# Patient Record
Sex: Male | Born: 1980 | Race: Black or African American | Hispanic: No | Marital: Married | Smoking: Former smoker
Health system: Southern US, Community
[De-identification: ages and names within clinical notes are randomized; demographics above are authoritative.]

## PROBLEM LIST (undated history)

## (undated) DIAGNOSIS — I499 Cardiac arrhythmia, unspecified: Secondary | ICD-10-CM

## (undated) DIAGNOSIS — R251 Tremor, unspecified: Secondary | ICD-10-CM

## (undated) DIAGNOSIS — I10 Essential (primary) hypertension: Secondary | ICD-10-CM

## (undated) DIAGNOSIS — E785 Hyperlipidemia, unspecified: Secondary | ICD-10-CM

## (undated) DIAGNOSIS — F32A Depression, unspecified: Secondary | ICD-10-CM

## (undated) DIAGNOSIS — F419 Anxiety disorder, unspecified: Secondary | ICD-10-CM

## (undated) DIAGNOSIS — J45909 Unspecified asthma, uncomplicated: Secondary | ICD-10-CM

## (undated) DIAGNOSIS — F909 Attention-deficit hyperactivity disorder, unspecified type: Secondary | ICD-10-CM

## (undated) DIAGNOSIS — R7303 Prediabetes: Secondary | ICD-10-CM

## (undated) DIAGNOSIS — K219 Gastro-esophageal reflux disease without esophagitis: Secondary | ICD-10-CM

## (undated) HISTORY — DX: Anxiety disorder, unspecified: F41.9

## (undated) HISTORY — DX: Attention-deficit hyperactivity disorder, unspecified type: F90.9

## (undated) HISTORY — DX: Depression, unspecified: F32.A

## (undated) HISTORY — PX: CHOLECYSTECTOMY: SHX55

---

## 2020-09-29 ENCOUNTER — Encounter: Payer: Self-pay | Admitting: Family Medicine

## 2020-09-29 ENCOUNTER — Other Ambulatory Visit: Payer: Self-pay

## 2020-09-29 ENCOUNTER — Ambulatory Visit (INDEPENDENT_AMBULATORY_CARE_PROVIDER_SITE_OTHER): Payer: 59 | Admitting: Family Medicine

## 2020-09-29 VITALS — BP 158/112 | HR 90 | Temp 98.4°F | Ht 69.0 in | Wt 246.6 lb

## 2020-09-29 DIAGNOSIS — E6609 Other obesity due to excess calories: Secondary | ICD-10-CM | POA: Diagnosis not present

## 2020-09-29 DIAGNOSIS — I10 Essential (primary) hypertension: Secondary | ICD-10-CM | POA: Diagnosis not present

## 2020-09-29 DIAGNOSIS — E66812 Obesity, class 2: Secondary | ICD-10-CM | POA: Insufficient documentation

## 2020-09-29 DIAGNOSIS — Z114 Encounter for screening for human immunodeficiency virus [HIV]: Secondary | ICD-10-CM | POA: Diagnosis not present

## 2020-09-29 DIAGNOSIS — Z1159 Encounter for screening for other viral diseases: Secondary | ICD-10-CM | POA: Diagnosis not present

## 2020-09-29 DIAGNOSIS — E662 Morbid (severe) obesity with alveolar hypoventilation: Secondary | ICD-10-CM | POA: Insufficient documentation

## 2020-09-29 DIAGNOSIS — F32A Depression, unspecified: Secondary | ICD-10-CM

## 2020-09-29 DIAGNOSIS — IMO0002 Reserved for concepts with insufficient information to code with codable children: Secondary | ICD-10-CM | POA: Insufficient documentation

## 2020-09-29 DIAGNOSIS — Z23 Encounter for immunization: Secondary | ICD-10-CM

## 2020-09-29 DIAGNOSIS — M26621 Arthralgia of right temporomandibular joint: Secondary | ICD-10-CM

## 2020-09-29 DIAGNOSIS — F5104 Psychophysiologic insomnia: Secondary | ICD-10-CM

## 2020-09-29 DIAGNOSIS — Z6836 Body mass index (BMI) 36.0-36.9, adult: Secondary | ICD-10-CM

## 2020-09-29 DIAGNOSIS — F419 Anxiety disorder, unspecified: Secondary | ICD-10-CM

## 2020-09-29 HISTORY — DX: Reserved for concepts with insufficient information to code with codable children: IMO0002

## 2020-09-29 HISTORY — DX: Arthralgia of right temporomandibular joint: M26.621

## 2020-09-29 MED ORDER — ZOLPIDEM TARTRATE 5 MG PO TABS: 5.0000 mg | ORAL_TABLET | Freq: Every evening | ORAL | 0 refills | Status: DC | PRN

## 2020-09-29 MED ORDER — AMLODIPINE BESYLATE 5 MG PO TABS: 5.0000 mg | ORAL_TABLET | Freq: Every day | ORAL | 0 refills | Status: DC

## 2020-09-29 MED ORDER — VORTIOXETINE HBR 10 MG PO TABS: 10.0000 mg | ORAL_TABLET | Freq: Every day | ORAL | 1 refills | Status: DC

## 2020-09-29 NOTE — Assessment & Plan Note (Signed)
Patient with history of the same, has been off of medications for quite some time, does describe "humming" sensation in right ear but otherwise asymptomatic.  Elevated blood pressure upon intake and recheck showed improved though still elevated blood pressure.  Physical exam reveals benign cardiopulmonary findings with specifically no abnormal heart sounds, no JVD, no bruits, no peripheral edema, and symmetric pulses throughout.  His abdomen is soft, nontender, without hepatosplenomegaly.  Plan for risk stratification labs, restart of his prior amlodipine at 5 mg, and close follow-up in 4 weeks. We will further modify treatments pending labs and clinical response noted at return.

## 2020-09-29 NOTE — Patient Instructions (Signed)
-   Start amlodipine and dose once daily - Start 5 mg Trintellix x7 days then start 10 mg Trintellix x7 days (Rx sent to pharmacy) - Gradually wean and ultimately aim to discontinue Ambien, to aid in this 1-2 tablets can be dosed nightly on an as-needed basis - Follow-up with psychiatry with referral placed

## 2020-09-29 NOTE — Progress Notes (Signed)
Primary Care / Sports Medicine Office Visit  Patient Information:  Patient ID: Jonathon Lozano, male DOB: [birth year] Age: 40 y.o. MRN: [id number]   Jonathon Lozano is a pleasant 40 y.o. male presenting with the following:  Chief Complaint  Patient presents with   New Patient (Initial Visit)   Establish Care    Moved from [place]   Ear Problem    Right ear and jaw; tinnitus "high frequencies" per patient; sensation of drainage; pain when chewing food; few weeks   ADHD    Needs psychiatry referral for medication refills; takes Vyvanse 70 mg daily, Adderall 20 mg three times daily, and Ambien 10 mg nightly, but has been without these medications for 3 months per patient   Depression    Usually on Wellbutrin 300 mg XL daily, but ran out of medication and is taking Welbutrin 150 mg XL daily currently    Review of Systems pertinent details above   Patient Active Problem List   Diagnosis Date Noted   Class 2 obesity due to excess calories with body mass index (BMI) of 36.0 to 36.9 in adult 09/29/2020   Primary hypertension 09/29/2020   Need for diphtheria-tetanus-pertussis (Tdap) vaccine 09/29/2020   Arthralgia of right temporomandibular joint 09/29/2020   Psychophysiological insomnia 09/29/2020   Anxiety and depression 09/29/2020   Past Medical History:  Diagnosis Date   Anxiety    Attention deficit hyperactivity disorder (ADHD)    Complications affecting other specified body systems, hypertension 09/29/2020   Formatting of this note might be different from the original.   Depression    Outpatient Encounter Medications as of 09/29/2020  Medication Sig   amLODipine (NORVASC) 5 MG tablet Take 1 tablet (5 mg total) by mouth daily.   buPROPion (WELLBUTRIN XL) 150 MG 24 hr tablet Take 150 mg by mouth daily.   cetirizine (ZYRTEC) 10 MG tablet Take 10 mg by mouth daily.   melatonin 5 MG TABS Take 5 mg by mouth at bedtime.   vortioxetine HBr (TRINTELLIX) 10 MG TABS  tablet Take 1 tablet (10 mg total) by mouth daily.   zolpidem (AMBIEN) 5 MG tablet Take 1-2 tablets (5-10 mg total) by mouth at bedtime as needed for sleep.   No facility-administered encounter medications on file as of 09/29/2020.   History reviewed. No pertinent surgical history.  Vitals:   09/29/20 0933 09/29/20 1053  BP: (!) 168/112 (!) 158/112  Pulse: 90   Temp: 98.4 F (36.9 C)   SpO2: 99%    Vitals:   09/29/20 0933  Weight: 246 lb 9.6 oz (111.9 kg)  Height: 5\' 9"  (1.753 m)   Body mass index is 36.42 kg/m.  No results found.   Independent interpretation of notes and tests performed by another provider:   None  Procedures performed:   None  Pertinent History, Exam, Impression, and Recommendations:   Primary hypertension Patient with history of the same, has been off of medications for quite some time, does describe "humming" sensation in right ear but otherwise asymptomatic.  Elevated blood pressure upon intake and recheck showed improved though still elevated blood pressure.  Physical exam reveals benign cardiopulmonary findings with specifically no abnormal heart sounds, no JVD, no bruits, no peripheral edema, and symmetric pulses throughout.  His abdomen is soft, nontender, without hepatosplenomegaly.  Plan for risk stratification labs, restart of his prior amlodipine at 5 mg, and close follow-up in 4 weeks. We will further modify treatments pending labs and clinical  response noted at return.  Arthralgia of right temporomandibular joint Patient with history of anxiety/depression, has been dosing stimulant medications for stated history of ADHD.  Presenting with right jaw pain noted upon awakening on a long-term basis, physical exam reveals focal tenderness at the right TMJ, benign appearing nasal turbinates, tympanic membranes, ear canals safe for minor erythema in the right ear canal, no tragal tenderness, sinuses nontender, and oropharynx benign.  Findings most  consistent with right TMJ arthralgia most likely secondary to teeth grinding, confirmed possibility of stress.  We will monitor this issue as we address anxiety/depression.  Need for diphtheria-tetanus-pertussis (Tdap) vaccine Patient received this today  Class 2 obesity due to excess calories with body mass index (BMI) of 36.0 to 36.9 in adult Risk stratification labs obtained, lifestyle modifications discussed  Psychophysiological insomnia Insomnia is a chronic issue with progressive worsening as he has self discontinued previous prescribed Wellbutrin with only recent restart.  He attributes his insomnia to racing thoughts.  This is previously addressed with Ambien 10 mg.  I have reviewed the nature of his concerns in light of his comorbid anxiety/depression.  Shared decision making performed and plan to follow-up with psychiatry, aggressively address anxiety/depression, and wean from Ambien planned.  I have written for 5 mg Ambien with 1-2-2 tablet nightly dosing on an as-needed basis.  Additional refills, if needed, will be through psychiatry if they deem this appropriate.  Anxiety and depression GAD 7 : Generalized Anxiety Score 09/29/2020  Nervous, Anxious, on Edge 1  Control/stop worrying 1  Worry too much - different things 1  Trouble relaxing 2  Restless 0  Easily annoyed or irritable 0  Afraid - awful might happen 1  Total GAD 7 Score 6  Anxiety Difficulty Somewhat difficult   Depression screen PHQ 2/9 09/29/2020  Decreased Interest 1  Down, Depressed, Hopeless 1  PHQ - 2 Score 2  Altered sleeping 1  Tired, decreased energy 1  Change in appetite 1  Feeling bad or failure about yourself  1  Trouble concentrating 2  Moving slowly or fidgety/restless 0  Suicidal thoughts 0  PHQ-9 Score 8  Difficult doing work/chores Very difficult   Patient with longstanding history of the same with recent worsening, current GAD and PHQ scores noted above.  Plan for transition to Trintellix  5 mg daily x7 days then 10 mg daily with prescription sent to his pharmacy.  Additionally, he is amenable to follow-up with psychiatry for further evaluation, management, and adjunct treatments.  Will follow up on this issue in 4 weeks time.    Orders & Medications Meds ordered this encounter  Medications   amLODipine (NORVASC) 5 MG tablet    Sig: Take 1 tablet (5 mg total) by mouth daily.    Dispense:  30 tablet    Refill:  0   vortioxetine HBr (TRINTELLIX) 10 MG TABS tablet    Sig: Take 1 tablet (10 mg total) by mouth daily.    Dispense:  30 tablet    Refill:  1    Use discount coupon   zolpidem (AMBIEN) 5 MG tablet    Sig: Take 1-2 tablets (5-10 mg total) by mouth at bedtime as needed for sleep.    Dispense:  60 tablet    Refill:  0    Orders Placed This Encounter  Procedures   Tdap vaccine greater than or equal to 7yo IM   CBC   Comprehensive metabolic panel   Lipid panel   TSH  VITAMIN D 25 Hydroxy (Vit-D Deficiency, Fractures)   Hepatitis C Antibody   HIV antibody (with reflex)   Ambulatory referral to Psychiatry      Return in about 4 weeks (around 10/27/2020).     Jerrol Banana, MD   Primary Care Sports Medicine [facility], [place]

## 2020-09-29 NOTE — Assessment & Plan Note (Signed)
Insomnia is a chronic issue with progressive worsening as he has self discontinued previous prescribed Wellbutrin with only recent restart.  He attributes his insomnia to racing thoughts.  This is previously addressed with Ambien 10 mg.  I have reviewed the nature of his concerns in light of his comorbid anxiety/depression.  Shared decision making performed and plan to follow-up with psychiatry, aggressively address anxiety/depression, and wean from Ambien planned.  I have written for 5 mg Ambien with 1-2-2 tablet nightly dosing on an as-needed basis.  Additional refills, if needed, will be through psychiatry if they deem this appropriate.

## 2020-09-29 NOTE — Assessment & Plan Note (Signed)
Patient with history of anxiety/depression, has been dosing stimulant medications for stated history of ADHD.  Presenting with right jaw pain noted upon awakening on a long-term basis, physical exam reveals focal tenderness at the right TMJ, benign appearing nasal turbinates, tympanic membranes, ear canals safe for minor erythema in the right ear canal, no tragal tenderness, sinuses nontender, and oropharynx benign.  Findings most consistent with right TMJ arthralgia most likely secondary to teeth grinding, confirmed possibility of stress.  We will monitor this issue as we address anxiety/depression.

## 2020-09-29 NOTE — Assessment & Plan Note (Signed)
GAD 7 : Generalized Anxiety Score 09/29/2020  Nervous, Anxious, on Edge 1  Control/stop worrying 1  Worry too much - different things 1  Trouble relaxing 2  Restless 0  Easily annoyed or irritable 0  Afraid - awful might happen 1  Total GAD 7 Score 6  Anxiety Difficulty Somewhat difficult   Depression screen PHQ 2/9 09/29/2020  Decreased Interest 1  Down, Depressed, Hopeless 1  PHQ - 2 Score 2  Altered sleeping 1  Tired, decreased energy 1  Change in appetite 1  Feeling bad or failure about yourself  1  Trouble concentrating 2  Moving slowly or fidgety/restless 0  Suicidal thoughts 0  PHQ-9 Score 8  Difficult doing work/chores Very difficult   Patient with longstanding history of the same with recent worsening, current GAD and PHQ scores noted above.  Plan for transition to Trintellix 5 mg daily x7 days then 10 mg daily with prescription sent to his pharmacy.  Additionally, he is amenable to follow-up with psychiatry for further evaluation, management, and adjunct treatments.  Will follow up on this issue in 4 weeks time.

## 2020-09-29 NOTE — Assessment & Plan Note (Signed)
Patient received this today

## 2020-09-29 NOTE — Assessment & Plan Note (Signed)
Risk stratification labs obtained, lifestyle modifications discussed

## 2020-10-01 ENCOUNTER — Telehealth: Payer: Self-pay

## 2020-10-01 DIAGNOSIS — F5104 Psychophysiologic insomnia: Secondary | ICD-10-CM

## 2020-10-01 MED ORDER — ZOLPIDEM TARTRATE 5 MG PO TABS: 5.0000 mg | ORAL_TABLET | Freq: Every evening | ORAL | 0 refills | Status: DC | PRN

## 2020-10-01 NOTE — Telephone Encounter (Signed)
Spoke with [place] at [facility]. Prescription for Ambien 5 mg was not picked up. Cancelled prescription.  Please send new prescription to [facility] in [place]. Pharmacy added.  Patient advised to use copay card through [facility] for prescription.

## 2020-10-02 ENCOUNTER — Telehealth: Payer: Self-pay

## 2020-10-02 LAB — LIPID PANEL
Chol/HDL Ratio: 4.3 ratio (ref 0.0–5.0)
Cholesterol, Total: 191 mg/dL (ref 100–199)
HDL: 44 mg/dL (ref >39)
LDL Chol Calc (NIH): 131 mg/dL — ABNORMAL HIGH (ref 0–99)
Triglycerides: 86 mg/dL (ref 0–149)
VLDL Cholesterol Cal: 16 mg/dL (ref 5–40)

## 2020-10-02 LAB — COMPREHENSIVE METABOLIC PANEL
ALT: 22 IU/L (ref 0–44)
AST: 19 IU/L (ref 0–40)
Albumin/Globulin Ratio: 1.7 (ref 1.2–2.2)
Albumin: 4.7 g/dL (ref 4.0–5.0)
Alkaline Phosphatase: 111 IU/L (ref 44–121)
BUN/Creatinine Ratio: 6 — ABNORMAL LOW (ref 9–20)
BUN: 8 mg/dL (ref 6–20)
Bilirubin Total: 0.4 mg/dL (ref 0.0–1.2)
CO2: 22 mmol/L (ref 20–29)
Calcium: 9.4 mg/dL (ref 8.7–10.2)
Chloride: 102 mmol/L (ref 96–106)
Creatinine, Ser: 1.39 mg/dL — ABNORMAL HIGH (ref 0.76–1.27)
Globulin, Total: 2.7 g/dL (ref 1.5–4.5)
Glucose: 87 mg/dL (ref 65–99)
Potassium: 4.2 mmol/L (ref 3.5–5.2)
Sodium: 140 mmol/L (ref 134–144)
Total Protein: 7.4 g/dL (ref 6.0–8.5)
eGFR: 67 mL/min/{1.73_m2} (ref >59)

## 2020-10-02 LAB — TSH: TSH: 2.15 u[IU]/mL (ref 0.450–4.500)

## 2020-10-02 LAB — CBC
Hematocrit: 46.1 % (ref 37.5–51.0)
Hemoglobin: 15.4 g/dL (ref 13.0–17.7)
MCH: 28.1 pg (ref 26.6–33.0)
MCHC: 33.4 g/dL (ref 31.5–35.7)
MCV: 84 fL (ref 79–97)
Platelets: 228 10*3/uL (ref 150–450)
RBC: 5.49 x10E6/uL (ref 4.14–5.80)
RDW: 13.2 % (ref 11.6–15.4)
WBC: 6.5 10*3/uL (ref 3.4–10.8)

## 2020-10-02 LAB — HEPATITIS C ANTIBODY: Hep C Virus Ab: <0.1 s/co ratio (ref 0.0–0.9)

## 2020-10-02 LAB — HIV ANTIBODY (ROUTINE TESTING W REFLEX): HIV Screen 4th Generation wRfx: NONREACTIVE

## 2020-10-02 LAB — VITAMIN D 25 HYDROXY (VIT D DEFICIENCY, FRACTURES): Vit D, 25-Hydroxy: 11.9 ng/mL — ABNORMAL LOW (ref 30.0–100.0)

## 2020-10-02 NOTE — Telephone Encounter (Signed)
Copied from CRM [id number]. Topic: General - Other >> Sep 30, 2020 10:41 AM Pawlus, Maxine Glenn A wrote: Reason for CRM: [facility] has tried sending a fax, please advise if you received anything. Caller stated there was an issue with the pts insurance and they were not able to schedule him.

## 2020-10-05 ENCOUNTER — Other Ambulatory Visit: Payer: Self-pay | Admitting: Family Medicine

## 2020-10-05 DIAGNOSIS — R7989 Other specified abnormal findings of blood chemistry: Secondary | ICD-10-CM

## 2020-10-05 MED ORDER — VITAMIN D (ERGOCALCIFEROL) 1.25 MG (50000 UNIT) PO CAPS: 50000.0000 [IU] | ORAL_CAPSULE | ORAL | 0 refills | Status: DC

## 2020-10-05 NOTE — Telephone Encounter (Signed)
Faxed received, 10/02/20, from [employer] Patient Assistance Program notifying of the enrollment and approval of the patient for Trintellix 10 mg daily for a 90 day supply with 3 refills through 10/01/2021.  Patient will need to re-enroll at that time.  First order of medication will be delivered in the next few days via pharmacy, [facility].  For your information.      Case Number: [id number]  Will contact [facility] about psychiatry referral.  Have not received a fax from them.

## 2020-10-20 ENCOUNTER — Other Ambulatory Visit: Payer: Self-pay

## 2020-10-20 ENCOUNTER — Ambulatory Visit: Payer: Self-pay | Admitting: *Deleted

## 2020-10-20 ENCOUNTER — Emergency Department: Payer: 59

## 2020-10-20 ENCOUNTER — Emergency Department
Admission: EM | Admit: 2020-10-20 | Discharge: 2020-10-20 | Disposition: A | Discharge status: Home / Self Care | Payer: 59 | Attending: Emergency Medicine | Admitting: Emergency Medicine

## 2020-10-20 DIAGNOSIS — Z87891 Personal history of nicotine dependence: Secondary | ICD-10-CM | POA: Insufficient documentation

## 2020-10-20 DIAGNOSIS — Z79899 Other long term (current) drug therapy: Secondary | ICD-10-CM | POA: Insufficient documentation

## 2020-10-20 DIAGNOSIS — R0789 Other chest pain: Secondary | ICD-10-CM | POA: Diagnosis not present

## 2020-10-20 DIAGNOSIS — I10 Essential (primary) hypertension: Secondary | ICD-10-CM | POA: Diagnosis not present

## 2020-10-20 DIAGNOSIS — I493 Ventricular premature depolarization: Secondary | ICD-10-CM

## 2020-10-20 LAB — CBC
HCT: 46.7 % (ref 39.0–52.0)
Hemoglobin: 16 g/dL (ref 13.0–17.0)
MCH: 28.7 pg (ref 26.0–34.0)
MCHC: 34.3 g/dL (ref 30.0–36.0)
MCV: 83.8 fL (ref 80.0–100.0)
Platelets: 261 10*3/uL (ref 150–400)
RBC: 5.57 MIL/uL (ref 4.22–5.81)
RDW: 12.6 % (ref 11.5–15.5)
WBC: 7.1 10*3/uL (ref 4.0–10.5)
nRBC: 0 % (ref 0.0–0.2)

## 2020-10-20 LAB — HEPATIC FUNCTION PANEL
ALT: 29 U/L (ref 0–44)
AST: 25 U/L (ref 15–41)
Albumin: 4.5 g/dL (ref 3.5–5.0)
Alkaline Phosphatase: 100 U/L (ref 38–126)
Bilirubin, Direct: <0.1 mg/dL (ref 0.0–0.2)
Total Bilirubin: 0.6 mg/dL (ref 0.3–1.2)
Total Protein: 7.8 g/dL (ref 6.5–8.1)

## 2020-10-20 LAB — BASIC METABOLIC PANEL
Anion gap: 4 — ABNORMAL LOW (ref 5–15)
BUN: 10 mg/dL (ref 6–20)
CO2: 30 mmol/L (ref 22–32)
Calcium: 9.4 mg/dL (ref 8.9–10.3)
Chloride: 106 mmol/L (ref 98–111)
Creatinine, Ser: 1.12 mg/dL (ref 0.61–1.24)
GFR, Estimated: >60 mL/min (ref >60)
Glucose, Bld: 109 mg/dL — ABNORMAL HIGH (ref 70–99)
Potassium: 3.8 mmol/L (ref 3.5–5.1)
Sodium: 140 mmol/L (ref 135–145)

## 2020-10-20 LAB — LIPASE, BLOOD: Lipase: 29 U/L (ref 11–51)

## 2020-10-20 LAB — TROPONIN I (HIGH SENSITIVITY)
Troponin I (High Sensitivity): 5 ng/L (ref <18)
Troponin I (High Sensitivity): 5 ng/L (ref <18)

## 2020-10-20 MED ORDER — SUCRALFATE 1 G PO TABS: 1.0000 g | ORAL_TABLET | Freq: Four times a day (QID) | ORAL | 1 refills | Status: DC

## 2020-10-20 MED ORDER — FAMOTIDINE 20 MG PO TABS: 20.0000 mg | ORAL_TABLET | Freq: Two times a day (BID) | ORAL | 0 refills | Status: DC

## 2020-10-20 MED ORDER — METOPROLOL TARTRATE 25 MG PO TABS: 25.0000 mg | ORAL_TABLET | Freq: Two times a day (BID) | ORAL | 0 refills | Status: DC

## 2020-10-20 NOTE — Telephone Encounter (Signed)
Reason for Disposition  [1] Chest pain (or "angina") comes and goes AND [2] is happening more often (increasing in frequency) or getting worse (increasing in severity) (Exception: chest pains that last only a few seconds)  Answer Assessment - Initial Assessment Questions 1. DESCRIPTION: "Please describe your heart rate or heartbeat that you are having" (e.g., fast/slow, regular/irregular, skipped or extra beats, "palpitations")     Upper chest and back pressure, patient is feeling in chest 2. ONSET: "When did it start?" (Minutes, hours or days)      On/off- few days, 2 hours ago today 3. DURATION: "How long does it last" (e.g., seconds, minutes, hours)     Intermittent in the past, today- lasting longer- more persistent  4. PATTERN "Does it come and go, or has it been constant since it started?"  "Does it get worse with exertion?"   "Are you feeling it now?"     Comes and goes 5. TAP: "Using your hand, can you tap out what you are feeling on a chair or table in front of you, so that I can hear?" (Note: not all patients can do this)       Feels normal-has delay then stronger beat after 6. HEART RATE: "Can you tell me your heart rate?" "How many beats in 15 seconds?"  (Note: not all patients can do this)       104 7. RECURRENT SYMPTOM: "Have you ever had this before?" If Yes, ask: "When was the last time?" and "What happened that time?"      no 8. CAUSE: "What do you think is causing the palpitations?"     unsure 9. CARDIAC HISTORY: "Do you have any history of heart disease?" (e.g., heart attack, angina, bypass surgery, angioplasty, arrhythmia)      No history- does take BP medication, LDL elevated  10. OTHER SYMPTOMS: "Do you have any other symptoms?" (e.g., dizziness, chest pain, sweating, difficulty breathing)       Breathing is effected- possibly more shallow 11. PREGNANCY: "Is there any chance you are pregnant?" "When was your last menstrual period?"       N/a  Protocols used: Heart  Rate and Heartbeat Questions-A-AH, Chest Pain-A-AH

## 2020-10-20 NOTE — Telephone Encounter (Signed)
Noted  Pt advised to go to ED.  KP 

## 2020-10-20 NOTE — Telephone Encounter (Signed)
Patient and wife are calling- patient is experiencing chest pressure on left and in shoulder. Patient states he feels he has irregular heart rate that seems to be getting worse. Advised ED for evaluation of symptoms.

## 2020-10-20 NOTE — ED Notes (Signed)
Repeat ekg done.  Rhythm strip done also.

## 2020-10-20 NOTE — ED Triage Notes (Signed)
Pt to ER with complaints of left sided chest pressure that radiates into left shoulder, reports mild shortness of breath. States he has also been feeling like he has an irregular heart beat. Reports one incidence of this before last Saturday that resolved. Also reports some sinus congestion and took sudafed and sinus relief medication.  Reports pain started this morning. Denies recent illness or travel.

## 2020-10-20 NOTE — ED Provider Notes (Signed)
[facility] Emergency Department Provider Note  ____________________________________________  Time seen: Approximately 4:37 PM  I have reviewed the triage vital signs and the nursing notes.   HISTORY  Chief Complaint Chest Pain    HPI Jonathon Lozano is a 40 y.o. male with a past history of anxiety depression hypertension who comes the ED complaining of left anterior chest pressure that radiates into the left shoulder.  No significant shortness of breath.  Not exertional, not pleuritic.  Intermittent, lasting about a minute at a time.  Feels better laying on his right side, worse laying on his left side, better standing upright and walking around.  Has a history of GERD but feels that its been adequately managed for quite a long time with lifestyle modification.  No tearing or searing pain.  Past Medical History:  Diagnosis Date  . Anxiety   . Attention deficit hyperactivity disorder (ADHD)   . Complications affecting other specified body systems, hypertension 09/29/2020   Formatting of this note might be different from the original.  . Depression      Patient Active Problem List   Diagnosis Date Noted  . Class 2 obesity due to excess calories with body mass index (BMI) of 36.0 to 36.9 in adult 09/29/2020  . Primary hypertension 09/29/2020  . Need for diphtheria-tetanus-pertussis (Tdap) vaccine 09/29/2020  . Arthralgia of right temporomandibular joint 09/29/2020  . Psychophysiological insomnia 09/29/2020  . Anxiety and depression 09/29/2020     History reviewed. No pertinent surgical history.   Prior to Admission medications   Medication Sig Start Date End Date Taking? Authorizing Provider  amLODipine (NORVASC) 5 MG tablet Take 1 tablet (5 mg total) by mouth daily. 09/29/20   Jerrol Banana, MD  buPROPion (WELLBUTRIN XL) 150 MG 24 hr tablet Take 150 mg by mouth daily.    [provider]  cetirizine (ZYRTEC) 10 MG tablet Take 10 mg  by mouth daily.    [provider]  melatonin 5 MG TABS Take 5 mg by mouth at bedtime.    [provider]  Vitamin D, Ergocalciferol, (DRISDOL) 1.25 MG (50000 UNIT) CAPS capsule Take 1 capsule (50,000 Units total) by mouth every 7 (seven) days. Take for 8 total doses(weeks) 10/05/20   Jerrol Banana, MD  vortioxetine HBr (TRINTELLIX) 10 MG TABS tablet Take 1 tablet (10 mg total) by mouth daily. 09/29/20   Jerrol Banana, MD  zolpidem (AMBIEN) 5 MG tablet Take 1-2 tablets (5-10 mg total) by mouth at bedtime as needed for sleep. 10/01/20   Jerrol Banana, MD     Allergies Patient has no known allergies.   Family History  Problem Relation Age of Onset  . Hypertension Mother   . Hyperlipidemia Father   . Hypertension Father   . Mental retardation Brother   . Colon cancer Maternal Uncle   . Dementia Maternal Grandmother   . Stroke Paternal Grandmother   . Pancreatic cancer Paternal Grandmother   . Stroke Paternal Grandfather   . Colon cancer Maternal Great-grandfather     Social History Social History   Tobacco Use  . Smoking status: Former    Packs/day: 0.50    Years: 13.00    Pack years: 6.50    Types: Cigarettes    Quit date: 2017    Years since quitting: 5.5  . Smokeless tobacco: Never  Vaping Use  . Vaping Use: Every day  . Substances: Nicotine, Flavoring  Substance Use Topics  . Alcohol use:  Not Currently  . Drug use: Not Currently    Review of Systems  Constitutional:   No fever or chills.  ENT:   No sore throat. No rhinorrhea. Cardiovascular:   Positive chest pain as above without syncope. Respiratory:   No dyspnea or cough. Gastrointestinal:   Negative for abdominal pain, vomiting and diarrhea.  Musculoskeletal:   Negative for focal pain or swelling All other systems reviewed and are negative except as documented above in ROS and HPI.  ____________________________________________   PHYSICAL EXAM:  VITAL SIGNS: ED Triage Vitals   Enc Vitals Group     BP 10/20/20 1446 (!) 160/107     Pulse Rate 10/20/20 1446 (!) 103     Resp 10/20/20 1446 18     Temp 10/20/20 1451 98 F (36.7 C)     Temp src --      SpO2 10/20/20 1446 100 %     Weight 10/20/20 1451 249 lb (112.9 kg)     Height 10/20/20 1451 5\' 9"  (1.753 m)     Head Circumference --      Peak Flow --      Pain Score 10/20/20 1451 1     Pain Loc --      Pain Edu? --      Excl. in GC? --     Vital signs reviewed, nursing assessments reviewed.   Constitutional:   Alert and oriented. Non-toxic appearance. Eyes:   Conjunctivae are normal. EOMI. PERRL. ENT      Head:   Normocephalic and atraumatic.      Nose: Normal.      Mouth/Throat:   dull diffuse oropharyngeal erythema.      Neck:   No meningismus. Full ROM. Hematological/Lymphatic/Immunilogical:   No cervical lymphadenopathy. Cardiovascular:   RRR. Symmetric bilateral radial and DP pulses.  No murmurs. Cap refill less than 2 seconds. Respiratory:   Normal respiratory effort without tachypnea/retractions. Breath sounds are clear and equal bilaterally. No wheezes/rales/rhonchi. Gastrointestinal:   Soft with mild left upper quadrant tenderness. Non distended. There is no CVA tenderness.  No rebound, rigidity, or guarding. Genitourinary:   deferred Musculoskeletal:   Normal range of motion in all extremities. No joint effusions.  No lower extremity tenderness.  No edema. Neurologic:   Normal speech and language.  Motor grossly intact. No acute focal neurologic deficits are appreciated.  Skin:    Skin is warm, dry and intact. No rash noted.  No petechiae, purpura, or bullae.  ____________________________________________    LABS (pertinent positives/negatives) (all labs ordered are listed, but only abnormal results are displayed) Labs Reviewed  BASIC METABOLIC PANEL - Abnormal; Notable for the following components:      Result Value   Glucose, Bld 109 (*)    Anion gap 4 (*)    All other components  within normal limits  CBC  LIPASE, BLOOD  HEPATIC FUNCTION PANEL  TROPONIN I (HIGH SENSITIVITY)  TROPONIN I (HIGH SENSITIVITY)   ____________________________________________   EKG  Interpreted by me Sinus tachycardia rate 105.  Left axis.  Normal intervals.  Poor R wave progression.  Normal ST segments.  Slight nonischemic T wave inversion in high lateral leads.  ____________________________________________    RADIOLOGY  DG Chest 2 View  Result Date: 10/20/2020 CLINICAL DATA:  left sided chest pressure that radiates into left shoulder, reports mild shortness of breath. EXAM: CHEST - 2 VIEW COMPARISON:  None. FINDINGS: The heart size and mediastinal contours are within normal limits. No focal consolidation. No  pulmonary edema. No pleural effusion. No pneumothorax. No acute osseous abnormality. IMPRESSION: No active cardiopulmonary disease. Electronically Signed   By: Tish Frederickson M.D.   On: 10/20/2020 15:11    ____________________________________________   PROCEDURES Procedures  ____________________________________________  DIFFERENTIAL DIAGNOSIS   Non-STEMI, GERD, anxiety, electrolyte abnormality, anemia  CLINICAL IMPRESSION / ASSESSMENT AND PLAN / ED COURSE  Medications ordered in the ED: Medications - No data to display  Pertinent labs & imaging results that were available during my care of the patient were reviewed by me and considered in my medical decision making (see chart for details).  Jonathon Lozano was evaluated in Emergency Department on 10/20/2020 for the symptoms described in the history of present illness. He was evaluated in the context of the global COVID-19 pandemic, which necessitated consideration that the patient might be at risk for infection with the SARS-CoV-2 virus that causes COVID-19. Institutional protocols and algorithms that pertain to the evaluation of patients at risk for COVID-19 are in a state of rapid change based on information  released by regulatory bodies including the CDC and federal and state organizations. These policies and algorithms were followed during the patient's care in the ED.   Patient presents with atypical chest pain. Considering the patient's symptoms, medical history, and physical examination today, I have low suspicion for ACS, PE, TAD, pneumothorax, carditis, mediastinitis, pneumonia, CHF, or sepsis.  EKG, chest x-ray, labs all unremarkable.  Will trend troponin, add on LFTs.  Most likely GERD, will recommend trial of antacids while planning follow-up with primary care if the remainder of lab work is reassuring.  Clinical Course as of 10/20/20 1759  Tue Oct 20, 2020  1745 Labs normal. Stable for DC home, trial of antacids. [PS]  1753 Pt describes frequent sensation of extra heartbeat.  ?PVCs / symptomatic palpitations. Will repeat EKG and capture during symptoms. Can try metoprolol bid. [PS]  1758 Rhythm strip confirms that patient sensation corresponds with PVC.  They are not frequent, heart rate is 75.  Repeat EKG is otherwise normal.  Stable for discharge. [PS]    Clinical Course User Index [PS] Sharman Cheek, MD     ____________________________________________   FINAL CLINICAL IMPRESSION(S) / ED DIAGNOSES    Final diagnoses:  Atypical chest pain     ED Discharge Orders     None       Portions of this note were generated with dragon dictation software. Dictation errors may occur despite best attempts at proofreading.   Sharman Cheek, MD 10/20/20 1640

## 2020-10-26 ENCOUNTER — Telehealth: Payer: Self-pay

## 2020-10-26 ENCOUNTER — Other Ambulatory Visit: Payer: Self-pay | Admitting: Family Medicine

## 2020-10-26 ENCOUNTER — Telehealth: Payer: Self-pay | Admitting: Family Medicine

## 2020-10-26 DIAGNOSIS — I10 Essential (primary) hypertension: Secondary | ICD-10-CM

## 2020-10-26 MED ORDER — AMLODIPINE BESYLATE 5 MG PO TABS: 5.0000 mg | ORAL_TABLET | Freq: Every day | ORAL | 0 refills | Status: DC

## 2020-10-26 NOTE — Telephone Encounter (Signed)
Patient had hepatic panel done a week ago and it was normal.

## 2020-10-26 NOTE — Telephone Encounter (Signed)
Copied from CRM [id number]. Topic: General - Other >> Oct 26, 2020 11:24 AM Gwenlyn Fudge wrote: Reason for CRM: Pt called stating that he is needing to have lab orders placed to be done before his appt tomorrow. He states that he this was supposed to be for renal function panel. Please advise.

## 2020-10-26 NOTE — Telephone Encounter (Signed)
Please advise for medication refills.

## 2020-10-26 NOTE — Telephone Encounter (Signed)
Pt is calling because he is requesting advice pt has new pt appt tomorrow. Pt was at [facility] on 10/25/20 and his cousin tested positve for COVID. Pt has no sx and would like to know should his appt be rescheduled?  Cb- [phone] or [phone]

## 2020-10-26 NOTE — Telephone Encounter (Signed)
MyChart message sent notifying patient.  Rescheduled to 11/06/20.

## 2020-10-26 NOTE — Telephone Encounter (Signed)
Please advise for Covid exposure.

## 2020-10-27 ENCOUNTER — Ambulatory Visit: Payer: 59 | Admitting: Family Medicine

## 2020-10-28 ENCOUNTER — Encounter: Payer: Self-pay | Admitting: Family Medicine

## 2020-10-28 ENCOUNTER — Other Ambulatory Visit: Payer: Self-pay | Admitting: Family Medicine

## 2020-10-28 DIAGNOSIS — I10 Essential (primary) hypertension: Secondary | ICD-10-CM

## 2020-10-28 NOTE — Telephone Encounter (Signed)
   Notes to clinic:  Review qty for refill Only 5 tabs listed    Requested Prescriptions  Pending Prescriptions Disp Refills   amLODipine (NORVASC) 5 MG tablet [Pharmacy Med Name: AMLODIPINE BESYLATE 5MG  TABLETS] 5 tablet 0    Sig: TAKE 1 TABLET(5 MG) BY MOUTH DAILY      Cardiovascular:  Calcium Channel Blockers Failed - 10/28/2020  3:38 AM      Failed - Last BP in normal range    BP Readings from Last 1 Encounters:  10/20/20 (!) 160/107          Passed - Valid encounter within last 6 months    Recent Outpatient Visits           4 weeks ago Primary hypertension   [facility] 12/21/20, MD       Future Appointments             In 5 days Jerrol Banana, Ashley Royalty, MD [facility], [facility]

## 2020-10-29 ENCOUNTER — Other Ambulatory Visit: Payer: Self-pay

## 2020-10-29 DIAGNOSIS — R7989 Other specified abnormal findings of blood chemistry: Secondary | ICD-10-CM

## 2020-10-31 LAB — RENAL FUNCTION PANEL
Albumin: 4.6 g/dL (ref 4.0–5.0)
BUN/Creatinine Ratio: 9 (ref 9–20)
BUN: 11 mg/dL (ref 6–20)
CO2: 23 mmol/L (ref 20–29)
Calcium: 9.5 mg/dL (ref 8.7–10.2)
Chloride: 101 mmol/L (ref 96–106)
Creatinine, Ser: 1.23 mg/dL (ref 0.76–1.27)
Glucose: 84 mg/dL (ref 65–99)
Phosphorus: 4.4 mg/dL — ABNORMAL HIGH (ref 2.8–4.1)
Potassium: 4.2 mmol/L (ref 3.5–5.2)
Sodium: 141 mmol/L (ref 134–144)
eGFR: 78 mL/min/{1.73_m2} (ref >59)

## 2020-11-02 ENCOUNTER — Encounter: Payer: Self-pay | Admitting: Family Medicine

## 2020-11-02 ENCOUNTER — Ambulatory Visit (INDEPENDENT_AMBULATORY_CARE_PROVIDER_SITE_OTHER): Payer: 59 | Admitting: Family Medicine

## 2020-11-02 ENCOUNTER — Other Ambulatory Visit: Payer: Self-pay

## 2020-11-02 VITALS — BP 138/98 | HR 66 | Temp 98.5°F | Ht 69.0 in | Wt 248.0 lb

## 2020-11-02 DIAGNOSIS — I10 Essential (primary) hypertension: Secondary | ICD-10-CM

## 2020-11-02 DIAGNOSIS — G478 Other sleep disorders: Secondary | ICD-10-CM | POA: Diagnosis not present

## 2020-11-02 DIAGNOSIS — F419 Anxiety disorder, unspecified: Secondary | ICD-10-CM | POA: Diagnosis not present

## 2020-11-02 DIAGNOSIS — F32A Depression, unspecified: Secondary | ICD-10-CM

## 2020-11-02 DIAGNOSIS — M26621 Arthralgia of right temporomandibular joint: Secondary | ICD-10-CM | POA: Diagnosis not present

## 2020-11-02 MED ORDER — METOPROLOL SUCCINATE ER 25 MG PO TB24: 25.0000 mg | ORAL_TABLET | Freq: Every day | ORAL | 2 refills | Status: DC

## 2020-11-02 MED ORDER — AMLODIPINE BESYLATE 10 MG PO TABS: 10.0000 mg | ORAL_TABLET | Freq: Every day | ORAL | 2 refills | Status: DC

## 2020-11-02 NOTE — Assessment & Plan Note (Signed)
>>  ASSESSMENT AND PLAN FOR AWAKENS FROM SLEEP AT NIGHT WRITTEN ON 11/02/2020  9:54 AM BY Tonesha Tsou J, MD  Patient with BMI 36.62, hypertension, nighttime awakenings, and waking up not fully rested.  Both of his parents have a formal diagnosis of OSA, plan for referral to ENT/sleep medicine for further evaluation and management.  Patient is amenable to this plan.

## 2020-11-02 NOTE — Assessment & Plan Note (Signed)
This has shown interval improvement, he is without chest pain, respiratory issues, or extremity edema.  He is complaining over new onset fatigue since metoprolol tartrate 25 mg twice daily prescribed to ER.  Cardiopulmonary exam is benign today.  He is amenable to continued healthy lifestyle modifications inclusive of diet and exercise, will increase amlodipine to 10 mg daily, changed to metoprolol succinate 25 mg once daily, and I have placed a referral for OSA evaluation through ENT/sleep medicine which can be contributory if confirmed.  Plan for follow-up and further risk modification pending clinical picture at return.

## 2020-11-02 NOTE — Progress Notes (Signed)
Primary Care / Sports Medicine Office Visit  Patient Information:  Patient ID: Jonathon Lozano, male DOB: [birth year] Age: 40 y.o. MRN: [id number]   Jonathon Lozano is a pleasant 40 y.o. male presenting with the following:  Chief Complaint  Patient presents with   Follow-up   Hypertension    Tolerating amlodipine well; metoprolol started for atypical chest pain after going to the ER 10/20/20 and patient reports fatigue on it   Anxiety    Taking Trintellix 10 mg with relief; PHQ and GAD have decreased since last visit; has not made an appointment with behavioral health yet    Review of Systems pertinent details above   Patient Active Problem List   Diagnosis Date Noted   Awakens from sleep at night 11/02/2020   Class 2 obesity due to excess calories with body mass index (BMI) of 36.0 to 36.9 in adult 09/29/2020   Primary hypertension 09/29/2020   Need for diphtheria-tetanus-pertussis (Tdap) vaccine 09/29/2020   Psychophysiological insomnia 09/29/2020   Anxiety and depression 09/29/2020   Past Medical History:  Diagnosis Date   Anxiety    Arthralgia of right temporomandibular joint 09/29/2020   Attention deficit hyperactivity disorder (ADHD)    Complications affecting other specified body systems, hypertension 09/29/2020   Formatting of this note might be different from the original.   Depression    Outpatient Encounter Medications as of 11/02/2020  Medication Sig   cetirizine (ZYRTEC) 10 MG tablet Take 10 mg by mouth daily.   melatonin 5 MG TABS Take 5 mg by mouth at bedtime.   metoprolol succinate (TOPROL-XL) 25 MG 24 hr tablet Take 1 tablet (25 mg total) by mouth daily.   Vitamin D, Ergocalciferol, (DRISDOL) 1.25 MG (50000 UNIT) CAPS capsule Take 1 capsule (50,000 Units total) by mouth every 7 (seven) days. Take for 8 total doses(weeks)   vortioxetine HBr (TRINTELLIX) 10 MG TABS tablet Take 1 tablet (10 mg total) by mouth daily.   zolpidem (AMBIEN) 5 MG tablet Take  1-2 tablets (5-10 mg total) by mouth at bedtime as needed for sleep.   [DISCONTINUED] amLODipine (NORVASC) 5 MG tablet Take 1 tablet (5 mg total) by mouth daily.   [DISCONTINUED] metoprolol tartrate (LOPRESSOR) 25 MG tablet Take 1 tablet (25 mg total) by mouth 2 (two) times daily for 15 days.   amLODipine (NORVASC) 10 MG tablet Take 1 tablet (10 mg total) by mouth daily.   [DISCONTINUED] buPROPion (WELLBUTRIN XL) 150 MG 24 hr tablet Take 150 mg by mouth daily.   No facility-administered encounter medications on file as of 11/02/2020.   History reviewed. No pertinent surgical history.  Vitals:   11/02/20 0813  BP: (!) 138/98  Pulse: 66  Temp: 98.5 F (36.9 C)  SpO2: 99%   Vitals:   11/02/20 0813  Weight: 248 lb (112.5 kg)  Height: 5\' 9"  (1.753 m)   Body mass index is 36.62 kg/m.  DG Chest 2 View  Result Date: 10/20/2020 CLINICAL DATA:  left sided chest pressure that radiates into left shoulder, reports mild shortness of breath. EXAM: CHEST - 2 VIEW COMPARISON:  None. FINDINGS: The heart size and mediastinal contours are within normal limits. No focal consolidation. No pulmonary edema. No pleural effusion. No pneumothorax. No acute osseous abnormality. IMPRESSION: No active cardiopulmonary disease. Electronically Signed   By: 12/21/2020 M.D.   On: 10/20/2020 15:11     Independent interpretation of notes and tests performed by another provider:   None  Procedures  performed:   None  Pertinent History, Exam, Impression, and Recommendations:   Primary hypertension This has shown interval improvement, he is without chest pain, respiratory issues, or extremity edema.  He is complaining over new onset fatigue since metoprolol tartrate 25 mg twice daily prescribed to ER.  Cardiopulmonary exam is benign today.  He is amenable to continued healthy lifestyle modifications inclusive of diet and exercise, will increase amlodipine to 10 mg daily, changed to metoprolol succinate 25 mg  once daily, and I have placed a referral for OSA evaluation through ENT/sleep medicine which can be contributory if confirmed.  Plan for follow-up and further risk modification pending clinical picture at return.  Arthralgia of right temporomandibular joint This is now a resolved issue, has noted improvement/resolution as his stress has been subjectively improved.  Anxiety and depression GAD 7 : Generalized Anxiety Score 11/02/2020 09/29/2020  Nervous, Anxious, on Edge 0 1  Control/stop worrying 0 1  Worry too much - different things 0 1  Trouble relaxing 0 2  Restless 0 0  Easily annoyed or irritable 0 0  Afraid - awful might happen 0 1  Total GAD 7 Score 0 6  Anxiety Difficulty Not difficult at all Somewhat difficult    Depression screen [facility] 2/9 11/02/2020 09/29/2020  Decreased Interest 1 1  Down, Depressed, Hopeless 1 1  PHQ - 2 Score 2 2  Altered sleeping 1 1  Tired, decreased energy 1 1  Change in appetite 0 1  Feeling bad or failure about yourself  0 1  Trouble concentrating 1 2  Moving slowly or fidgety/restless 0 0  Suicidal thoughts 0 0  PHQ-9 Score 5 8  Difficult doing work/chores Somewhat difficult Very difficult   He has shown improvement though given his persistent symptomatology primarily affecting sleep, plan for further titration to Trintellix 20 mg which she will dose by combining to 10 mg tablets daily.  He is to contact us in 2 weeks to provide a status update, if 20 mg dosing has provided adequate improvement/benefit, a refill with 20 mg will be sent to his pharmacy.  He will otherwise return to his 10 mg daily dosing.  He is still awaiting psychiatry follow-up, plans to maintain this plan of following up with psychiatry once scheduled.  Awakens from sleep at night Patient with BMI 36.62, hypertension, nighttime awakenings, and waking up not fully rested.  Both of his parents have a formal diagnosis of OSA, plan for referral to ENT/sleep medicine for further  evaluation and management.  Patient is amenable to this plan.    Orders & Medications Meds ordered this encounter  Medications   amLODipine (NORVASC) 10 MG tablet    Sig: Take 1 tablet (10 mg total) by mouth daily.    Dispense:  30 tablet    Refill:  2   metoprolol succinate (TOPROL-XL) 25 MG 24 hr tablet    Sig: Take 1 tablet (25 mg total) by mouth daily.    Dispense:  30 tablet    Refill:  2    Orders Placed This Encounter  Procedures   Ambulatory referral to ENT      Return in about 6 weeks (around 12/14/2020).     Jerrol Banana, MD   Primary Care Sports Medicine [facility]

## 2020-11-02 NOTE — Patient Instructions (Signed)
-   Start new dose of amlodipine - Double dose Trintellix (dose 20 mg total daily) - Referral coordinator will contact you for ENT follow-up - Send MyChart update 2 weeks - Return for follow-up in 6 weeks

## 2020-11-02 NOTE — Assessment & Plan Note (Signed)
Patient with BMI 36.62, hypertension, nighttime awakenings, and waking up not fully rested.  Both of his parents have a formal diagnosis of OSA, plan for referral to ENT/sleep medicine for further evaluation and management.  Patient is amenable to this plan.

## 2020-11-02 NOTE — Assessment & Plan Note (Signed)
This is now a resolved issue, has noted improvement/resolution as his stress has been subjectively improved.

## 2020-11-02 NOTE — Assessment & Plan Note (Signed)
GAD 7 : Generalized Anxiety Score 11/02/2020 09/29/2020  Nervous, Anxious, on Edge 0 1  Control/stop worrying 0 1  Worry too much - different things 0 1  Trouble relaxing 0 2  Restless 0 0  Easily annoyed or irritable 0 0  Afraid - awful might happen 0 1  Total GAD 7 Score 0 6  Anxiety Difficulty Not difficult at all Somewhat difficult    Depression screen [facility] 2/9 11/02/2020 09/29/2020  Decreased Interest 1 1  Down, Depressed, Hopeless 1 1  PHQ - 2 Score 2 2  Altered sleeping 1 1  Tired, decreased energy 1 1  Change in appetite 0 1  Feeling bad or failure about yourself  0 1  Trouble concentrating 1 2  Moving slowly or fidgety/restless 0 0  Suicidal thoughts 0 0  PHQ-9 Score 5 8  Difficult doing work/chores Somewhat difficult Very difficult   He has shown improvement though given his persistent symptomatology primarily affecting sleep, plan for further titration to Trintellix 20 mg which she will dose by combining to 10 mg tablets daily.  He is to contact us in 2 weeks to provide a status update, if 20 mg dosing has provided adequate improvement/benefit, a refill with 20 mg will be sent to his pharmacy.  He will otherwise return to his 10 mg daily dosing.  He is still awaiting psychiatry follow-up, plans to maintain this plan of following up with psychiatry once scheduled.

## 2020-11-06 ENCOUNTER — Ambulatory Visit: Payer: 59 | Admitting: Family Medicine

## 2020-11-23 ENCOUNTER — Encounter: Payer: Self-pay | Admitting: Family Medicine

## 2020-11-24 NOTE — Telephone Encounter (Signed)
We have been filling it for him. Please go ahead and submit assistance paperwork for the 20 mg dose, can also contact patient to make sure he does not run out of the medication (he can pick up additional samples if needed).

## 2020-11-24 NOTE — Telephone Encounter (Signed)
Paperwork updated to reflect Trintellix 20 mg daily and faxed.  Pending response.  For your information.

## 2020-11-29 ENCOUNTER — Other Ambulatory Visit: Payer: Self-pay | Admitting: Family Medicine

## 2020-11-29 DIAGNOSIS — R7989 Other specified abnormal findings of blood chemistry: Secondary | ICD-10-CM

## 2020-11-29 NOTE — Telephone Encounter (Signed)
Requested medication (s) are due for refill today: yes  Requested medication (s) are on the active medication list: yes  Last refill:  yes  Future visit scheduled: yes  Notes to clinic:  med not delegated to NT to RF   Requested Prescriptions  Pending Prescriptions Disp Refills   Vitamin D, Ergocalciferol, (DRISDOL) 1.25 MG (50000 UNIT) CAPS capsule [Pharmacy Med Name: VITAMIN D2 50,000IU (ERGO) CAP RX] 8 capsule 0    Sig: TAKE ONE CAPSULE BY MOUTH EVERY 7 DAYS, TAKE FOR 8 TOTAL DOSES(WEEKS)     Endocrinology:  Vitamins - Vitamin D Supplementation Failed - 11/29/2020  3:47 AM      Failed - 50,000 IU strengths are not delegated      Failed - Phosphate in normal range and within 360 days    Phosphorus  Date Value Ref Range Status  10/30/2020 4.4 (H) 2.8 - 4.1 mg/dL Final          Failed - Vitamin D in normal range and within 360 days    Vit D, 25-Hydroxy  Date Value Ref Range Status  10/01/2020 11.9 (L) 30.0 - 100.0 ng/mL Final    Comment:    Vitamin D deficiency has been defined by the Institute of Medicine and an Endocrine Society practice guideline as a level of serum 25-OH vitamin D less than 20 ng/mL (1,2). The Endocrine Society went on to further define vitamin D insufficiency as a level between 21 and 29 ng/mL (2). 1. IOM (Institute of Medicine). 2010. Dietary reference    intakes for calcium and D. [place]: The    [employer]. 2. Holick MF, Binkley [place], Bischoff-Ferrari HA, et al.    Evaluation, treatment, and prevention of vitamin D    deficiency: an Endocrine Society clinical practice    guideline. JCEM. 2011 Jul; 96(7):1911-30.           Passed - Ca in normal range and within 360 days    Calcium  Date Value Ref Range Status  10/30/2020 9.5 8.7 - 10.2 mg/dL Final          Passed - Valid encounter within last 12 months    Recent Outpatient Visits           3 weeks ago Awakens from sleep at night   [facility] Jerrol Banana, MD   2 months ago Primary hypertension   [facility] Jerrol Banana, MD       Future Appointments             In 2 weeks Ashley Royalty, Ocie Bob, MD [facility], [facility]

## 2020-12-14 ENCOUNTER — Encounter: Payer: Self-pay | Admitting: Family Medicine

## 2020-12-14 ENCOUNTER — Other Ambulatory Visit: Payer: Self-pay

## 2020-12-14 ENCOUNTER — Ambulatory Visit (INDEPENDENT_AMBULATORY_CARE_PROVIDER_SITE_OTHER): Payer: 59 | Admitting: Family Medicine

## 2020-12-14 VITALS — BP 128/86 | HR 75 | Temp 98.5°F | Ht 69.0 in | Wt 251.0 lb

## 2020-12-14 DIAGNOSIS — F5104 Psychophysiologic insomnia: Secondary | ICD-10-CM | POA: Diagnosis not present

## 2020-12-14 DIAGNOSIS — I10 Essential (primary) hypertension: Secondary | ICD-10-CM

## 2020-12-14 DIAGNOSIS — F419 Anxiety disorder, unspecified: Secondary | ICD-10-CM | POA: Diagnosis not present

## 2020-12-14 DIAGNOSIS — M26609 Unspecified temporomandibular joint disorder, unspecified side: Secondary | ICD-10-CM | POA: Insufficient documentation

## 2020-12-14 DIAGNOSIS — F32A Depression, unspecified: Secondary | ICD-10-CM

## 2020-12-14 NOTE — Progress Notes (Signed)
Primary Care / Sports Medicine Office Visit  Patient Information:  Patient ID: Jonathon Lozano, male DOB: Dec 26, 1980 Age: 40 y.o. MRN: [id number]   Jonathon Lozano is a pleasant 40 y.o. 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   Awakens from sleep at night 11/02/2020   Class 2 obesity due to excess calories with body mass index (BMI) of 36.0 to 36.9 in adult 09/29/2020   Primary hypertension 09/29/2020   Need for diphtheria-tetanus-pertussis (Tdap) vaccine 09/29/2020   Psychophysiological insomnia 09/29/2020   Anxiety and depression 09/29/2020   Past Medical History:  Diagnosis Date   Anxiety    Arthralgia of right temporomandibular joint 09/29/2020   Attention deficit hyperactivity disorder (ADHD)    Complications affecting other specified body systems, hypertension 09/29/2020   Formatting of this note might be different from the original.   Depression    Outpatient Encounter Medications as of 12/14/2020  Medication Sig   amLODipine (NORVASC) 10 MG tablet Take 1 tablet (10 mg total) by mouth daily.   cetirizine (ZYRTEC) 10 MG tablet Take 10 mg by mouth daily.   melatonin 5 MG TABS Take 5 mg by mouth at bedtime.   metoprolol succinate (TOPROL-XL) 25 MG 24 hr tablet Take 1 tablet (25 mg total) by mouth daily.   Vitamin D, Ergocalciferol, (DRISDOL) 1.25 MG (50000 UNIT) CAPS capsule TAKE ONE CAPSULE BY MOUTH EVERY 7 DAYS, TAKE FOR 8 TOTAL DOSES(WEEKS)   vortioxetine HBr (TRINTELLIX) 20 MG TABS tablet Take 20 mg by mouth daily.   zolpidem (AMBIEN) 5 MG tablet Take 1-2 tablets (5-10 mg total) by mouth at bedtime as needed for sleep. (Patient not taking: Reported on 12/14/2020)   [DISCONTINUED] vortioxetine HBr (TRINTELLIX) 10 MG TABS tablet Take 1 tablet (10 mg total) by  mouth daily.   No facility-administered encounter medications on file as of 12/14/2020.   History reviewed. No pertinent surgical history.  Vitals:   12/14/20 0910  BP: 128/86  Pulse: 75  Temp: 98.5 F (36.9 C)  SpO2: 99%   Vitals:   12/14/20 0910  Weight: 251 lb (113.9 kg)  Height: 5\' 9"  (1.753 m)   Body mass index is 37.07 kg/m.  No results found.   Independent interpretation of notes and tests performed by another provider:   None  Procedures performed:   None  Pertinent History, Exam, Impression, and Recommendations:   Anxiety and depression Depression screen [facility] 2/9 12/14/2020 11/02/2020 09/29/2020  Decreased Interest 1 1 1   Down, Depressed, Hopeless 1 1 1   PHQ - 2 Score 2 2 2   Altered sleeping 3 1 1   Tired, decreased energy 1 1 1   Change in appetite 0 0 1  Feeling bad or failure about yourself  1 0 1  Trouble concentrating 2 1 2   Moving slowly or fidgety/restless 1 0 0  Suicidal thoughts 0 0 0  PHQ-9 Score 10 5 8   Difficult doing work/chores Very difficult Somewhat difficult Very difficult   GAD 7 : Generalized Anxiety Score 12/14/2020 11/02/2020 09/29/2020  Nervous, Anxious, on Edge 1 0 1  Control/stop worrying 1 0 1  Worry too much - different things 1 0 1  Trouble relaxing 0 0 2  Restless 0 0 0  Easily annoyed or irritable 0  0 0  Afraid - awful might happen 1 0 1  Total GAD 7 Score 4 0 6  Anxiety Difficulty Somewhat difficult Not difficult at all Somewhat difficult   Scores with recent increase despite titration of Trintellix, he cites from recent family stressors (job change for spouse) and has known sleep disorder issues which can all be thought contributory.  I have revisited our previous referral to psychiatry, he is amenable to following up with them for further management options both pharmacologic and nonpharmacologic in nature.  He is also citing diminished libido which we reviewed is a commonly noted adverse effect from this category of  medications.  We did discuss management options such as decreased dose, etc., and for now he is amenable to following up with psychiatry for additional management options.  He is to contact us in several weeks if wishing to pursue dose change.  Primary hypertension Blood pressure well controlled on new/current regimen, no adverse effects reported such as excessive fatigue.  Cardiopulmonary examination benign, no lower extremity edema.  At this stage he will continue this current regimen, follow-up as needed.  Psychophysiological insomnia Patient still endorses issues with sleep, daytime napping, nighttime awakenings, did not have an opportunity to follow-up with PFT/sleep medicine.  He did have a few weeks of better and restful sleep however given his recent worsening sleep, I have revisited the previous referral to ENT/sleep medicine and he is amenable to following up/establishing care with our group.  We will follow peripherally on this issue.   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  Examination today reveals benign external and internal canals, tympanic membranes benign. 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  That being said, he does have upcoming visit with ENT/sleep medicine, I have advised him to review this issue with them for further evaluation management options.    Orders & Medications No orders of the defined types were placed in this encounter.  No orders of the defined types were placed in this encounter.    Return if symptoms worsen or fail to improve.     Jerrol Banana, MD   Primary Care Sports Medicine [facility]

## 2020-12-14 NOTE — Assessment & Plan Note (Signed)
Blood pressure well controlled on new/current regimen, no adverse effects reported such as excessive fatigue.  Cardiopulmonary examination benign, no lower extremity edema.  At this stage he will continue this current regimen, follow-up as needed.

## 2020-12-14 NOTE — Assessment & Plan Note (Signed)
Longstanding issue reported at initial visit, his primary pain has resolved however he is now noting right ear canal paresthesia.  Examination today reveals benign external and internal canals, tympanic membranes benign. 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  That being said, he does have upcoming visit with ENT/sleep medicine, I have advised him to review this issue with them for further evaluation management options.

## 2020-12-14 NOTE — Assessment & Plan Note (Signed)
Patient still endorses issues with sleep, daytime napping, nighttime awakenings, did not have an opportunity to follow-up with PFT/sleep medicine.  He did have a few weeks of better and restful sleep however given his recent worsening sleep, I have revisited the previous referral to ENT/sleep medicine and he is amenable to following up/establishing care with our group.  We will follow peripherally on this issue.

## 2020-12-14 NOTE — Assessment & Plan Note (Addendum)
Depression screen [facility] 2/9 12/14/2020 11/02/2020 09/29/2020  Decreased Interest 1 1 1   Down, Depressed, Hopeless 1 1 1   PHQ - 2 Score 2 2 2   Altered sleeping 3 1 1   Tired, decreased energy 1 1 1   Change in appetite 0 0 1  Feeling bad or failure about yourself  1 0 1  Trouble concentrating 2 1 2   Moving slowly or fidgety/restless 1 0 0  Suicidal thoughts 0 0 0  PHQ-9 Score 10 5 8   Difficult doing work/chores Very difficult Somewhat difficult Very difficult   GAD 7 : Generalized Anxiety Score 12/14/2020 11/02/2020 09/29/2020  Nervous, Anxious, on Edge 1 0 1  Control/stop worrying 1 0 1  Worry too much - different things 1 0 1  Trouble relaxing 0 0 2  Restless 0 0 0  Easily annoyed or irritable 0 0 0  Afraid - awful might happen 1 0 1  Total GAD 7 Score 4 0 6  Anxiety Difficulty Somewhat difficult Not difficult at all Somewhat difficult   Scores with recent increase despite titration of Trintellix, he cites from recent family stressors (job change for spouse) and has known sleep disorder issues which can all be thought contributory.  I have revisited our previous referral to psychiatry, he is amenable to following up with them for further management options both pharmacologic and nonpharmacologic in nature.  He is also citing diminished libido which we reviewed is a commonly noted adverse effect from this category of medications.  We did discuss management options such as decreased dose, etc., and for now he is amenable to following up with psychiatry for additional management options.  He is to contact in several weeks if wishing to pursue dose change.

## 2020-12-14 NOTE — Patient Instructions (Signed)
-   Use melatonin to "reset" your sleep cycle and can use as-needed - Consider sleep hygiene adjuncts (sleep mindfulness audio) - Recommend follow-up with psychiatry and ENT / sleep medicine - Contact at the 6 week timeframe or beyond if considering Trintellix dose change - Follow-up as-needed

## 2021-01-30 ENCOUNTER — Other Ambulatory Visit: Payer: Self-pay | Admitting: Family Medicine

## 2021-01-30 DIAGNOSIS — I10 Essential (primary) hypertension: Secondary | ICD-10-CM

## 2021-01-30 DIAGNOSIS — R7989 Other specified abnormal findings of blood chemistry: Secondary | ICD-10-CM

## 2021-01-30 NOTE — Telephone Encounter (Signed)
Requested Prescriptions  Pending Prescriptions Disp Refills  . metoprolol succinate (TOPROL-XL) 25 MG 24 hr tablet [Pharmacy Med Name: METOPROLOL ER SUCCINATE 25MG  TABS] 90 tablet 0    Sig: TAKE 1 TABLET(25 MG) BY MOUTH DAILY     Cardiovascular:  Beta Blockers Passed - 01/30/2021  3:45 AM      Passed - Last BP in normal range    BP Readings from Last 1 Encounters:  12/14/20 128/86         Passed - Last Heart Rate in normal range    Pulse Readings from Last 1 Encounters:  12/14/20 75         Passed - Valid encounter within last 6 months    Recent Outpatient Visits          1 month ago Primary hypertension   [facility] 12/16/20, MD   2 months ago Awakens from sleep at night   [facility], MD   4 months ago Primary hypertension   [facility] Jerrol Banana, MD             . Vitamin D, Ergocalciferol, (DRISDOL) 1.25 MG (50000 UNIT) CAPS capsule [Pharmacy Med Name: VITAMIN D2 50,000IU (ERGO) CAP RX] 8 capsule 0    Sig: TAKE ONE CAPSULE BY MOUTH EVERY 7 DAYS FOR 8 WEEKS     Endocrinology:  Vitamins - Vitamin D Supplementation Failed - 01/30/2021  3:45 AM      Failed - 50,000 IU strengths are not delegated      Failed - Phosphate in normal range and within 360 days    Phosphorus  Date Value Ref Range Status  10/30/2020 4.4 (H) 2.8 - 4.1 mg/dL Final         Failed - Vitamin D in normal range and within 360 days    Vit D, 25-Hydroxy  Date Value Ref Range Status  10/01/2020 11.9 (L) 30.0 - 100.0 ng/mL Final    Comment:    Vitamin D deficiency has been defined by the Institute of Medicine and an Endocrine Society practice guideline as a level of serum 25-OH vitamin D less than 20 ng/mL (1,2). The Endocrine Society went on to further define vitamin D insufficiency as a level between 21 and 29 ng/mL (2). 1. IOM (Institute of Medicine). 2010. Dietary reference    intakes for calcium and D. [place]: The     2011. 2. Holick MF, Binkley , Bischoff-Ferrari HA, et al.    Evaluation, treatment, and prevention of vitamin D    deficiency: an Endocrine Society clinical practice    guideline. JCEM. 2011 Jul; 96(7):1911-30.          Passed - Ca in normal range and within 360 days    Calcium  Date Value Ref Range Status  10/30/2020 9.5 8.7 - 10.2 mg/dL Final         Passed - Valid encounter within last 12 months    Recent Outpatient Visits          1 month ago Primary hypertension   [facility] 11/01/2020, MD   2 months ago Awakens from sleep at night   [facility], MD   4 months ago Primary hypertension   [facility], MD             . amLODipine (NORVASC) 10 MG tablet [Pharmacy Med Name: AMLODIPINE BESYLATE 10MG  TABLETS]  90 tablet 0    Sig: TAKE 1 TABLET(10 MG) BY MOUTH DAILY     Cardiovascular:  Calcium Channel Blockers Passed - 01/30/2021  3:45 AM      Passed - Last BP in normal range    BP Readings from Last 1 Encounters:  12/14/20 128/86         Passed - Valid encounter within last 6 months    Recent Outpatient Visits          1 month ago Primary hypertension   [facility] Jerrol Banana, MD   2 months ago Awakens from sleep at night   [facility] Jerrol Banana, MD   4 months ago Primary hypertension   [facility] Jerrol Banana, MD

## 2021-01-30 NOTE — Telephone Encounter (Signed)
Requested medications are on the active medication list yes  Last refill 12/28/20  Last visit 12/14/20  Future visit scheduled no  Notes to clinic Not Delegated.  Requested Prescriptions  Pending Prescriptions Disp Refills   Vitamin D, Ergocalciferol, (DRISDOL) 1.25 MG (50000 UNIT) CAPS capsule [Pharmacy Med Name: VITAMIN D2 50,000IU (ERGO) CAP RX] 8 capsule 0    Sig: TAKE ONE CAPSULE BY MOUTH EVERY 7 DAYS FOR 8 WEEKS     Endocrinology:  Vitamins - Vitamin D Supplementation Failed - 01/30/2021  3:45 AM      Failed - 50,000 IU strengths are not delegated      Failed - Phosphate in normal range and within 360 days    Phosphorus  Date Value Ref Range Status  10/30/2020 4.4 (H) 2.8 - 4.1 mg/dL Final          Failed - Vitamin D in normal range and within 360 days    Vit D, 25-Hydroxy  Date Value Ref Range Status  10/01/2020 11.9 (L) 30.0 - 100.0 ng/mL Final    Comment:    Vitamin D deficiency has been defined by the Institute of Medicine and an Endocrine Society practice guideline as a level of serum 25-OH vitamin D less than 20 ng/mL (1,2). The Endocrine Society went on to further define vitamin D insufficiency as a level between 21 and 29 ng/mL (2). 1. IOM (Institute of Medicine). 2010. Dietary reference    intakes for calcium and D. [place]: The    [employer]. 2. Holick MF, Binkley [place], Bischoff-Ferrari HA, et al.    Evaluation, treatment, and prevention of vitamin D    deficiency: an Endocrine Society clinical practice    guideline. JCEM. 2011 Jul; 96(7):1911-30.           Passed - Ca in normal range and within 360 days    Calcium  Date Value Ref Range Status  10/30/2020 9.5 8.7 - 10.2 mg/dL Final          Passed - Valid encounter within last 12 months    Recent Outpatient Visits           1 month ago Primary hypertension   [facility] Jerrol Banana, MD   2 months ago Awakens from sleep at night   [facility]  Jerrol Banana, MD   4 months ago Primary hypertension   [facility] Jerrol Banana, MD              Signed Prescriptions Disp Refills   metoprolol succinate (TOPROL-XL) 25 MG 24 hr tablet 90 tablet 0    Sig: TAKE 1 TABLET(25 MG) BY MOUTH DAILY     Cardiovascular:  Beta Blockers Passed - 01/30/2021  3:45 AM      Passed - Last BP in normal range    BP Readings from Last 1 Encounters:  12/14/20 128/86          Passed - Last Heart Rate in normal range    Pulse Readings from Last 1 Encounters:  12/14/20 75          Passed - Valid encounter within last 6 months    Recent Outpatient Visits           1 month ago Primary hypertension   [facility] Jerrol Banana, MD   2 months ago Awakens from sleep at night   [facility] Jerrol Banana, MD   4 months ago Primary hypertension  [facility] Jerrol Banana, MD               amLODipine (NORVASC) 10 MG tablet 90 tablet 0    Sig: TAKE 1 TABLET(10 MG) BY MOUTH DAILY     Cardiovascular:  Calcium Channel Blockers Passed - 01/30/2021  3:45 AM      Passed - Last BP in normal range    BP Readings from Last 1 Encounters:  12/14/20 128/86          Passed - Valid encounter within last 6 months    Recent Outpatient Visits           1 month ago Primary hypertension   [facility] Jerrol Banana, MD   2 months ago Awakens from sleep at night   [facility] Jerrol Banana, MD   4 months ago Primary hypertension   [facility] Jerrol Banana, MD

## 2021-02-01 ENCOUNTER — Encounter: Payer: Self-pay | Admitting: Family Medicine

## 2021-02-01 ENCOUNTER — Other Ambulatory Visit: Payer: Self-pay | Admitting: Family Medicine

## 2021-02-01 DIAGNOSIS — R7989 Other specified abnormal findings of blood chemistry: Secondary | ICD-10-CM

## 2021-02-01 DIAGNOSIS — I10 Essential (primary) hypertension: Secondary | ICD-10-CM

## 2021-02-03 ENCOUNTER — Telehealth: Payer: Self-pay | Admitting: Family Medicine

## 2021-02-03 ENCOUNTER — Other Ambulatory Visit: Payer: Self-pay

## 2021-02-03 DIAGNOSIS — R7989 Other specified abnormal findings of blood chemistry: Secondary | ICD-10-CM

## 2021-02-03 DIAGNOSIS — I10 Essential (primary) hypertension: Secondary | ICD-10-CM

## 2021-02-03 MED ORDER — AMLODIPINE BESYLATE 10 MG PO TABS: ORAL_TABLET | ORAL | 0 refills | Status: DC

## 2021-02-03 NOTE — Telephone Encounter (Signed)
Requested Prescriptions  Refused Prescriptions Disp Refills  . amLODipine (NORVASC) 10 MG tablet [Pharmacy Med Name: AMLODIPINE BESYLATE 10MG  TABLETS] 30 tablet     Sig: TAKE 1 TABLET(10 MG) BY MOUTH DAILY     Cardiovascular:  Calcium Channel Blockers Passed - 02/03/2021  9:30 AM      Passed - Last BP in normal range    BP Readings from Last 1 Encounters:  12/14/20 128/86         Passed - Valid encounter within last 6 months    Recent Outpatient Visits          1 month ago Primary hypertension   [facility] 12/16/20, MD   3 months ago Awakens from sleep at night   [facility], MD   4 months ago Primary hypertension   [facility] Jerrol Banana, MD             . Vitamin D, Ergocalciferol, (DRISDOL) 1.25 MG (50000 UNIT) CAPS capsule [Pharmacy Med Name: VITAMIN D2 50,000IU (ERGO) CAP RX] 8 capsule 0    Sig: TAKE ONE CAPSULE BY MOUTH EVERY 7 DAYS FOR 8 WEEKS     Endocrinology:  Vitamins - Vitamin D Supplementation Failed - 02/03/2021  9:30 AM      Failed - 50,000 IU strengths are not delegated      Failed - Phosphate in normal range and within 360 days    Phosphorus  Date Value Ref Range Status  10/30/2020 4.4 (H) 2.8 - 4.1 mg/dL Final         Failed - Vitamin D in normal range and within 360 days    Vit D, 25-Hydroxy  Date Value Ref Range Status  10/01/2020 11.9 (L) 30.0 - 100.0 ng/mL Final    Comment:    Vitamin D deficiency has been defined by the Institute of Medicine and an Endocrine Society practice guideline as a level of serum 25-OH vitamin D less than 20 ng/mL (1,2). The Endocrine Society went on to further define vitamin D insufficiency as a level between 21 and 29 ng/mL (2). 1. IOM (Institute of Medicine). 2010. Dietary reference    intakes for calcium and D. [place]: The    2011. 2. Holick MF, Binkley [place], Bischoff-Ferrari HA, et al.    Evaluation, treatment, and  prevention of vitamin D    deficiency: an Endocrine Society clinical practice    guideline. JCEM. 2011 Jul; 96(7):1911-30.          Passed - Ca in normal range and within 360 days    Calcium  Date Value Ref Range Status  10/30/2020 9.5 8.7 - 10.2 mg/dL Final         Passed - Valid encounter within last 12 months    Recent Outpatient Visits          1 month ago Primary hypertension   [facility] 11/01/2020, MD   3 months ago Awakens from sleep at night   [facility], MD   4 months ago Primary hypertension   [facility], MD

## 2021-02-03 NOTE — Telephone Encounter (Signed)
Patient says didn't receive vitamin D on 08/15, (reason for refusal) and says is out of amlodipine, he hasnt received yet kand is leaving town Friday. Please call back about this.

## 2021-02-04 NOTE — Telephone Encounter (Signed)
Left message for patient to return call. Amlodipine sent in yesterday, 02/03/21.  Vitamin D prescription sent in 11/30/20 for 8 doses which patient should have completed.  If he did not pick up the medication, they should still be able to fill it from original prescription.

## 2021-05-01 ENCOUNTER — Other Ambulatory Visit: Payer: Self-pay | Admitting: Family Medicine

## 2021-05-01 DIAGNOSIS — I10 Essential (primary) hypertension: Secondary | ICD-10-CM

## 2021-05-01 NOTE — Telephone Encounter (Signed)
Requested Prescriptions  Pending Prescriptions Disp Refills   metoprolol succinate (TOPROL-XL) 25 MG 24 hr tablet [Pharmacy Med Name: METOPROLOL ER SUCCINATE 25MG  TABS] 90 tablet 0    Sig: TAKE 1 TABLET(25 MG) BY MOUTH DAILY     Cardiovascular:  Beta Blockers Passed - 05/01/2021  3:41 AM      Passed - Last BP in normal range    BP Readings from Last 1 Encounters:  12/14/20 128/86         Passed - Last Heart Rate in normal range    Pulse Readings from Last 1 Encounters:  12/14/20 75         Passed - Valid encounter within last 6 months    Recent Outpatient Visits          4 months ago Primary hypertension   [facility] 12/16/20, MD   6 months ago Awakens from sleep at night   [facility], MD   7 months ago Primary hypertension   [facility], MD

## 2021-05-03 ENCOUNTER — Other Ambulatory Visit: Payer: Self-pay | Admitting: Family Medicine

## 2021-05-03 DIAGNOSIS — I10 Essential (primary) hypertension: Secondary | ICD-10-CM

## 2021-05-03 NOTE — Telephone Encounter (Signed)
Requested Prescriptions  Pending Prescriptions Disp Refills   amLODipine (NORVASC) 10 MG tablet [Pharmacy Med Name: AMLODIPINE BESYLATE 10MG  TABLETS] 90 tablet 0    Sig: TAKE 1 TABLET(10 MG) BY MOUTH DAILY     Cardiovascular:  Calcium Channel Blockers Passed - 05/03/2021  3:42 AM      Passed - Last BP in normal range    BP Readings from Last 1 Encounters:  12/14/20 128/86         Passed - Valid encounter within last 6 months    Recent Outpatient Visits          4 months ago Primary hypertension   [facility] 12/16/20, MD   6 months ago Awakens from sleep at night   [facility], MD   7 months ago Primary hypertension   [facility], MD

## 2021-08-04 ENCOUNTER — Other Ambulatory Visit: Payer: Self-pay | Admitting: Family Medicine

## 2021-08-04 DIAGNOSIS — I10 Essential (primary) hypertension: Secondary | ICD-10-CM

## 2021-08-04 NOTE — Telephone Encounter (Signed)
Requested Prescriptions  ?Pending Prescriptions Disp Refills  ?? amLODipine (NORVASC) 10 MG tablet [Pharmacy Med Name: AMLODIPINE BESYLATE 10MG  TABLETS] 90 tablet 0  ?  Sig: TAKE 1 TABLET(10 MG) BY MOUTH DAILY  ?  ? Cardiovascular: Calcium Channel Blockers 2 Failed - 08/04/2021  3:36 AM  ?  ?  Failed - Valid encounter within last 6 months  ?  Recent Outpatient Visits   ?      ? 7 months ago Primary hypertension  ? [facility] Montel Culver, MD  ? 9 months ago Awakens from sleep at night  ? [facility] Montel Culver, MD  ? 10 months ago Primary hypertension  ? [facility] Montel Culver, MD  ?  ?  ? ?  ?  ?  Passed - Last BP in normal range  ?  BP Readings from Last 1 Encounters:  ?12/14/20 128/86  ?   ?  ?  Passed - Last Heart Rate in normal range  ?  Pulse Readings from Last 1 Encounters:  ?12/14/20 75  ?   ?  ?  ?? metoprolol succinate (TOPROL-XL) 25 MG 24 hr tablet [Pharmacy Med Name: METOPROLOL ER SUCCINATE 25MG  TABS] 90 tablet 0  ?  Sig: TAKE 1 TABLET(25 MG) BY MOUTH DAILY  ?  ? Cardiovascular:  Beta Blockers Failed - 08/04/2021  3:36 AM  ?  ?  Failed - Valid encounter within last 6 months  ?  Recent Outpatient Visits   ?      ? 7 months ago Primary hypertension  ? [facility] Montel Culver, MD  ? 9 months ago Awakens from sleep at night  ? [facility] Montel Culver, MD  ? 10 months ago Primary hypertension  ? [facility] Montel Culver, MD  ?  ?  ? ?  ?  ?  Passed - Last BP in normal range  ?  BP Readings from Last 1 Encounters:  ?12/14/20 128/86  ?   ?  ?  Passed - Last Heart Rate in normal range  ?  Pulse Readings from Last 1 Encounters:  ?12/14/20 75  ?   ?  ?  ? ?

## 2021-10-29 ENCOUNTER — Other Ambulatory Visit: Payer: Self-pay | Admitting: Family Medicine

## 2021-10-29 DIAGNOSIS — I10 Essential (primary) hypertension: Secondary | ICD-10-CM

## 2021-11-02 ENCOUNTER — Ambulatory Visit (INDEPENDENT_AMBULATORY_CARE_PROVIDER_SITE_OTHER): Payer: 59 | Admitting: Family Medicine

## 2021-11-02 ENCOUNTER — Encounter: Payer: Self-pay | Admitting: Family Medicine

## 2021-11-02 VITALS — BP 150/102 | HR 93 | Ht 69.0 in | Wt 261.0 lb

## 2021-11-02 DIAGNOSIS — E785 Hyperlipidemia, unspecified: Secondary | ICD-10-CM | POA: Diagnosis not present

## 2021-11-02 DIAGNOSIS — F419 Anxiety disorder, unspecified: Secondary | ICD-10-CM | POA: Diagnosis not present

## 2021-11-02 DIAGNOSIS — G478 Other sleep disorders: Secondary | ICD-10-CM | POA: Diagnosis not present

## 2021-11-02 DIAGNOSIS — I10 Essential (primary) hypertension: Secondary | ICD-10-CM

## 2021-11-02 DIAGNOSIS — F32A Depression, unspecified: Secondary | ICD-10-CM

## 2021-11-02 MED ORDER — VORTIOXETINE HBR 20 MG PO TABS: 20.0000 mg | ORAL_TABLET | Freq: Every day | ORAL | 0 refills | Status: DC

## 2021-11-02 MED ORDER — METOPROLOL SUCCINATE ER 25 MG PO TB24: ORAL_TABLET | ORAL | 0 refills | Status: DC

## 2021-11-02 MED ORDER — AMLODIPINE BESYLATE 10 MG PO TABS: 10.0000 mg | ORAL_TABLET | Freq: Every day | ORAL | 0 refills | Status: DC

## 2021-11-02 NOTE — Progress Notes (Signed)
Primary Care / Sports Medicine Office Visit  Patient Information:  Patient ID: Jonathon Lozano, male DOB: 01-06-1981 Age: 41 y.o. MRN: [id number]   Jonathon Lozano is a pleasant 41 y.o. male presenting with the following:  No chief complaint on file.   Vitals:   11/02/21 1553  BP: (!) 150/102  Pulse: 93  SpO2: 98%   Vitals:   11/02/21 1553  Weight: 261 lb (118.4 kg)  Height: 5\' 9"  (1.753 m)   Body mass index is 38.54 kg/m.  No results found.   Independent interpretation of notes and tests performed by another provider:   None  Procedures performed:   None  Pertinent History, Exam, Impression, and Recommendations:   Problem List Items Addressed This Visit       Cardiovascular and Mediastinum   Primary hypertension - Primary    Previously controlled hypertension on dual therapy amlodipine 10 mg and metoprolol succinate 25 mg noted last during 11/2020 visit.  He has not gained any interval weight and his mood has been controlled with Trintellix 20 mg, despite this his blood pressure has been noted to be elevated on 2 readings during today's visit.  Upon further questioning there is concern for sleep apnea with nighttime pauses in breathing, daytime fatigue.  [place] is traveling out of the country [place] month, rest. Cardiopulmonary examination findings today are benign.  We will order risk stratification labs, coordinate follow-up in 2 months, plan for home sleep study once he is back in the country.      Relevant Medications   amLODipine (NORVASC) 10 MG tablet   metoprolol succinate (TOPROL-XL) 25 MG 24 hr tablet   Other Relevant Orders   Apo A1 + B + Ratio   CBC   Comprehensive metabolic panel   Lipid panel   TSH     Other   Anxiety and depression    Improved scores on Trintellix 20 mg, will continue to monitor.      Relevant Medications   vortioxetine HBr (TRINTELLIX) 20 MG TABS tablet   Awakens from sleep at night    In the setting of progressive  hypertension, concern for obstructive sleep apnea.  Previous referral to ENT/sleep medicine has been placed, however this was not completed.  Plan for home sleep study to review when patient returns.      Hyperlipidemia    Risk stratification labs ordered      Relevant Medications   amLODipine (NORVASC) 10 MG tablet   metoprolol succinate (TOPROL-XL) 25 MG 24 hr tablet   Other Relevant Orders   Apo A1 + B + Ratio   Lipid panel     Orders & Medications Meds ordered this encounter  Medications   vortioxetine HBr (TRINTELLIX) 20 MG TABS tablet    Sig: Take 1 tablet (20 mg total) by mouth daily.    Dispense:  90 tablet    Refill:  0   amLODipine (NORVASC) 10 MG tablet    Sig: Take 1 tablet (10 mg total) by mouth daily.    Dispense:  90 tablet    Refill:  0   metoprolol succinate (TOPROL-XL) 25 MG 24 hr tablet    Sig: TAKE 1 TABLET(25 MG) BY MOUTH DAILY    Dispense:  90 tablet    Refill:  0   Orders Placed This Encounter  Procedures   Apo A1 + B + Ratio   CBC   Comprehensive metabolic panel   Lipid panel   TSH  Return in about 2 months (around 01/03/2022).     Jerrol Banana, MD   Primary Care Sports Medicine [facility]

## 2021-11-02 NOTE — Assessment & Plan Note (Signed)
In the setting of progressive hypertension, concern for obstructive sleep apnea.  Previous referral to ENT/sleep medicine has been placed, however this was not completed.  Plan for home sleep study to review when patient returns.

## 2021-11-02 NOTE — Assessment & Plan Note (Signed)
Improved scores on Trintellix 20 mg, will continue to monitor.

## 2021-11-02 NOTE — Assessment & Plan Note (Signed)
Risk stratification labs ordered 

## 2021-11-02 NOTE — Assessment & Plan Note (Signed)
>>  ASSESSMENT AND PLAN FOR AWAKENS FROM SLEEP AT NIGHT WRITTEN ON 11/02/2021  4:36 PM BY Demetrie Borge J, MD  In the setting of progressive hypertension, concern for obstructive sleep apnea.  Previous referral to ENT/sleep medicine has been placed, however this was not completed.  Plan for home sleep study to review when patient returns.

## 2021-11-02 NOTE — Assessment & Plan Note (Signed)
Previously controlled hypertension on dual therapy amlodipine 10 mg and metoprolol succinate 25 mg noted last during 11/2020 visit.  He has not gained any interval weight and his mood has been controlled with Trintellix 20 mg, despite this his blood pressure has been noted to be elevated on 2 readings during today's visit.  Upon further questioning there is concern for sleep apnea with nighttime pauses in breathing, daytime fatigue.  He is traveling out of the country x1 month, rest. Cardiopulmonary examination findings today are benign.  We will order risk stratification labs, coordinate follow-up in 2 months, plan for home sleep study once he is back in the country.

## 2021-11-04 ENCOUNTER — Telehealth: Payer: Self-pay

## 2021-11-04 NOTE — Telephone Encounter (Signed)
PA completed waiting on insurance approval.  Key: [id number]  KP

## 2021-11-05 ENCOUNTER — Telehealth: Payer: Self-pay

## 2021-11-05 NOTE — Telephone Encounter (Signed)
Approved  11/04/21-11/05/22  KP

## 2021-11-05 NOTE — Telephone Encounter (Signed)
error 

## 2021-12-31 DIAGNOSIS — I10 Essential (primary) hypertension: Secondary | ICD-10-CM | POA: Diagnosis not present

## 2021-12-31 DIAGNOSIS — E785 Hyperlipidemia, unspecified: Secondary | ICD-10-CM | POA: Diagnosis not present

## 2022-01-01 LAB — APO A1 + B + RATIO
Apolipo. B/A-1 Ratio: 1.1 ratio — ABNORMAL HIGH (ref 0.0–0.7)
Apolipoprotein A-1: 137 mg/dL (ref 101–178)
Apolipoprotein B: 147 mg/dL — ABNORMAL HIGH (ref <90)

## 2022-01-01 LAB — CBC
Hematocrit: 47.3 % (ref 37.5–51.0)
Hemoglobin: 15.9 g/dL (ref 13.0–17.7)
MCH: 28.9 pg (ref 26.6–33.0)
MCHC: 33.6 g/dL (ref 31.5–35.7)
MCV: 86 fL (ref 79–97)
Platelets: 340 10*3/uL (ref 150–450)
RBC: 5.5 x10E6/uL (ref 4.14–5.80)
RDW: 13.3 % (ref 11.6–15.4)
WBC: 8.6 10*3/uL (ref 3.4–10.8)

## 2022-01-01 LAB — COMPREHENSIVE METABOLIC PANEL
ALT: 29 IU/L (ref 0–44)
AST: 22 IU/L (ref 0–40)
Albumin/Globulin Ratio: 1.8 (ref 1.2–2.2)
Albumin: 4.9 g/dL (ref 4.1–5.1)
Alkaline Phosphatase: 116 IU/L (ref 44–121)
BUN/Creatinine Ratio: 7 — ABNORMAL LOW (ref 9–20)
BUN: 10 mg/dL (ref 6–20)
Bilirubin Total: 0.5 mg/dL (ref 0.0–1.2)
CO2: 22 mmol/L (ref 20–29)
Calcium: 9.8 mg/dL (ref 8.7–10.2)
Chloride: 99 mmol/L (ref 96–106)
Creatinine, Ser: 1.35 mg/dL — ABNORMAL HIGH (ref 0.76–1.27)
Globulin, Total: 2.7 g/dL (ref 1.5–4.5)
Glucose: 81 mg/dL (ref 70–99)
Potassium: 4.2 mmol/L (ref 3.5–5.2)
Sodium: 140 mmol/L (ref 134–144)
Total Protein: 7.6 g/dL (ref 6.0–8.5)
eGFR: 69 mL/min/{1.73_m2} (ref >59)

## 2022-01-01 LAB — LIPID PANEL
Chol/HDL Ratio: 6.2 ratio — ABNORMAL HIGH (ref 0.0–5.0)
Cholesterol, Total: 281 mg/dL — ABNORMAL HIGH (ref 100–199)
HDL: 45 mg/dL (ref >39)
LDL Chol Calc (NIH): 204 mg/dL — ABNORMAL HIGH (ref 0–99)
Triglycerides: 167 mg/dL — ABNORMAL HIGH (ref 0–149)
VLDL Cholesterol Cal: 32 mg/dL (ref 5–40)

## 2022-01-01 LAB — TSH: TSH: 2.88 u[IU]/mL (ref 0.450–4.500)

## 2022-01-03 ENCOUNTER — Encounter: Payer: Self-pay | Admitting: Family Medicine

## 2022-01-03 ENCOUNTER — Ambulatory Visit (INDEPENDENT_AMBULATORY_CARE_PROVIDER_SITE_OTHER): Payer: 59 | Admitting: Family Medicine

## 2022-01-03 VITALS — BP 130/86 | HR 74 | Ht 69.0 in | Wt 258.0 lb

## 2022-01-03 DIAGNOSIS — Z0000 Encounter for general adult medical examination without abnormal findings: Secondary | ICD-10-CM | POA: Diagnosis not present

## 2022-01-03 DIAGNOSIS — R69 Illness, unspecified: Secondary | ICD-10-CM | POA: Diagnosis not present

## 2022-01-03 DIAGNOSIS — E785 Hyperlipidemia, unspecified: Secondary | ICD-10-CM | POA: Diagnosis not present

## 2022-01-03 DIAGNOSIS — F419 Anxiety disorder, unspecified: Secondary | ICD-10-CM | POA: Diagnosis not present

## 2022-01-03 DIAGNOSIS — I10 Essential (primary) hypertension: Secondary | ICD-10-CM

## 2022-01-03 DIAGNOSIS — Z23 Encounter for immunization: Secondary | ICD-10-CM

## 2022-01-03 DIAGNOSIS — F32A Depression, unspecified: Secondary | ICD-10-CM

## 2022-01-03 MED ORDER — VORTIOXETINE HBR 20 MG PO TABS: 20.0000 mg | ORAL_TABLET | Freq: Every day | ORAL | 3 refills | Status: DC

## 2022-01-03 MED ORDER — AMLODIPINE BESYLATE 10 MG PO TABS: 10.0000 mg | ORAL_TABLET | Freq: Every day | ORAL | 1 refills | Status: DC

## 2022-01-03 MED ORDER — METOPROLOL SUCCINATE ER 25 MG PO TB24: ORAL_TABLET | ORAL | 1 refills | Status: DC

## 2022-01-03 NOTE — Assessment & Plan Note (Signed)
Annual examination completed, risk stratification labs reviewed, anticipatory guidance provided.  We will follow labs once resulted.

## 2022-01-03 NOTE — Assessment & Plan Note (Signed)
Doing very well on Trintellix 20 mg daily, will continue at this dose.

## 2022-01-03 NOTE — Assessment & Plan Note (Signed)
Annual influenza vaccination administered today. 

## 2022-01-03 NOTE — Patient Instructions (Addendum)
-   Obtain fasting labs with orders provided (can have water or black coffee but otherwise no food or drink x 8 hours before labs) - Review information provided - Attend eye doctor annually, dentist every 6 months, work towards or maintain 30 minutes of moderate intensity physical activity at least 5 days per week, and consume a balanced diet - Return in 6 months - Contact us for any questions between now and then 

## 2022-01-03 NOTE — Progress Notes (Signed)
Annual Physical Exam Visit  Patient Information:  Patient ID: Jonathon Lozano, male DOB: 09-09-1980 Age: 41 y.o. MRN: [id number]   Subjective:   CC: Annual Physical Exam  HPI:  Jonathon Lozano is here for their annual physical.  I reviewed the past medical history, family history, social history, surgical history, and allergies today and changes were made as necessary.  Please see the problem list section below for additional details.  Past Medical History: Past Medical History:  Diagnosis Date   Anxiety    Arthralgia of right temporomandibular joint 09/29/2020   Attention deficit hyperactivity disorder (ADHD)    Complications affecting other specified body systems, hypertension 09/29/2020   Formatting of this note might be different from the original.   Depression    Past Surgical History: History reviewed. No pertinent surgical history. Family History: Family History  Problem Relation Age of Onset   Hypertension Mother    Hyperlipidemia Father    Hypertension Father    Mental retardation Brother    Colon cancer Maternal Uncle    Dementia Maternal Grandmother    Stroke Paternal Grandmother    Pancreatic cancer Paternal Grandmother    Stroke Paternal Grandfather    Colon cancer Maternal Great-grandfather    Allergies: No Known Allergies Health Maintenance: Health Maintenance  Topic Date Due   TETANUS/TDAP  09/30/2030   INFLUENZA VACCINE  Completed   Hepatitis C Screening  Completed   HIV Screening  Completed   HPV VACCINES  Aged Out   COVID-19 Vaccine  Discontinued    HM Colonoscopy     This patient has no relevant Health Maintenance data.      Medications: Current Outpatient Medications on File Prior to Visit  Medication Sig Dispense Refill   cetirizine (ZYRTEC) 10 MG tablet Take 10 mg by mouth daily.     melatonin 5 MG TABS Take 5 mg by mouth at bedtime.     No current facility-administered medications on file prior to visit.    Review  of Systems: No headache, visual changes, nausea, vomiting, diarrhea, constipation, dizziness, abdominal pain, skin rash, fevers, chills, night sweats, swollen lymph nodes, weight loss, chest pain, body aches, joint swelling, muscle aches, shortness of breath, mood changes, visual or auditory hallucinations reported.  Objective:   Vitals:   01/03/22 1029  BP: 130/86  Pulse: 74  SpO2: 99%   Vitals:   01/03/22 1029  Weight: 258 lb (117 kg)  Height: 5\' 9"  (1.753 m)   Body mass index is 38.1 kg/m.  General: Well Developed, well nourished, and in no acute distress.  Neuro: Alert and oriented x3, extra-ocular muscles intact, sensation grossly intact. Cranial nerves II through XII are grossly intact, motor, sensory, and coordinative functions are intact. HEENT: Normocephalic, atraumatic, pupils equal round reactive to light, neck supple, no masses, no lymphadenopathy, thyroid nonpalpable. Oropharynx, nasopharynx, external ear canals are unremarkable. Skin: Warm and dry, no rashes noted.  Cardiac: Regular rate and rhythm, no murmurs rubs or gallops. No peripheral edema. Pulses symmetric. Respiratory: Clear to auscultation bilaterally. Not using accessory muscles, speaking in full sentences.  Abdominal: Soft, nontender, nondistended, positive bowel sounds, no masses, no organomegaly. Musculoskeletal: Shoulder, elbow, wrist, hip, knee, ankle stable, and with full range of motion.   Impression and Recommendations:   The patient was counselled, risk factors were discussed, and anticipatory guidance given.  Problem List Items Addressed This Visit       Cardiovascular and Mediastinum   Primary hypertension  Excellent interval improvement, still mildly elevated, asymptomatic from a cardiopulmonary standpoint.  We discussed reducing risk factors, return in 6 months for reevaluation.      Relevant Medications   amLODipine (NORVASC) 10 MG tablet   metoprolol succinate (TOPROL-XL) 25 MG 24 hr  tablet     Other   Anxiety and depression    Doing very well on Trintellix 20 mg daily, will continue at this dose.      Relevant Medications   vortioxetine HBr (TRINTELLIX) 20 MG TABS tablet   Hyperlipidemia    Interval worsening on lipid panel in the setting of travel abroad and dietary changes, discussed lifestyle interventions, will reevaluate in 6 months at his return.      Relevant Medications   amLODipine (NORVASC) 10 MG tablet   metoprolol succinate (TOPROL-XL) 25 MG 24 hr tablet   Annual physical exam - Primary    Annual examination completed, risk stratification labs reviewed, anticipatory guidance provided.  We will follow labs once resulted.      Need for immunization against influenza    Annual influenza vaccination administered today.      Relevant Orders   Flu Vaccine QUAD 83mo+IM (Fluarix, Fluzone & Alfiuria Quad PF) (Completed)     Orders & Medications Medications:  Meds ordered this encounter  Medications   amLODipine (NORVASC) 10 MG tablet    Sig: Take 1 tablet (10 mg total) by mouth daily.    Dispense:  90 tablet    Refill:  1   metoprolol succinate (TOPROL-XL) 25 MG 24 hr tablet    Sig: TAKE 1 TABLET(25 MG) BY MOUTH DAILY    Dispense:  90 tablet    Refill:  1   vortioxetine HBr (TRINTELLIX) 20 MG TABS tablet    Sig: Take 1 tablet (20 mg total) by mouth daily.    Dispense:  90 tablet    Refill:  3   Orders Placed This Encounter  Procedures   Flu Vaccine QUAD 57mo+IM (Fluarix, Fluzone & Alfiuria Quad PF)     Return in about 6 months (around 07/04/2022) for BP follow-up.    Montel Culver, MD   Primary Care Sports Medicine [place]

## 2022-01-03 NOTE — Assessment & Plan Note (Signed)
Excellent interval improvement, still mildly elevated, asymptomatic from a cardiopulmonary standpoint.  We discussed reducing risk factors, return in 6 months for reevaluation.

## 2022-01-03 NOTE — Assessment & Plan Note (Signed)
Interval worsening on lipid panel in the setting of travel abroad and dietary changes, discussed lifestyle interventions, will reevaluate in 6 months at his return.

## 2022-01-05 ENCOUNTER — Telehealth: Payer: Self-pay

## 2022-01-05 NOTE — Telephone Encounter (Signed)
PA completed waiting for insurance approval.  Key: [id number]  KP

## 2022-01-06 ENCOUNTER — Encounter: Payer: Self-pay | Admitting: Family Medicine

## 2022-01-06 NOTE — Telephone Encounter (Signed)
Denied  KP 

## 2022-01-29 ENCOUNTER — Other Ambulatory Visit: Payer: Self-pay | Admitting: Family Medicine

## 2022-01-29 DIAGNOSIS — I10 Essential (primary) hypertension: Secondary | ICD-10-CM

## 2022-01-31 NOTE — Telephone Encounter (Signed)
Unable to refill per protocol, request is too soon last refill 01/03/22 for 90 and 1 RF.E-Prescribing Status: Receipt confirmed by pharmacy (01/03/2022 11:07 AM EDT). Request is too soon, will refuse.  Requested Prescriptions  Pending Prescriptions Disp Refills  . metoprolol succinate (TOPROL-XL) 25 MG 24 hr tablet [Pharmacy Med Name: METOPROLOL ER SUCCINATE 25MG  TABS] 90 tablet 1    Sig: TAKE 1 TABLET(25 MG) BY MOUTH DAILY     Cardiovascular:  Beta Blockers Passed - 01/29/2022  3:48 AM      Passed - Last BP in normal range    BP Readings from Last 1 Encounters:  01/03/22 130/86         Passed - Last Heart Rate in normal range    Pulse Readings from Last 1 Encounters:  01/03/22 74         Passed - Valid encounter within last 6 months    Recent Outpatient Visits          4 weeks ago Annual physical exam   [place] [facility] at [place], Earley Abide, MD   3 months ago Primary hypertension   [place] [facility] at [place], Earley Abide, MD   1 year ago Primary hypertension   [place] and [facility] Medicine at [place], Earley Abide, MD   1 year ago Awakens from sleep at night   [place] and [facility] Medicine at [place], Earley Abide, MD   1 year ago Primary hypertension   [place] [facility] at [place], MD      Future Appointments            In 5 months Zigmund Daniel, Earley Abide, MD [facility] and [facility] Medicine at [facility] [place]           . amLODipine ([place]) 10 MG tablet [Pharmacy Med Name: AMLODIPINE BESYLATE 10MG  TABLETS] 90 tablet 1    Sig: TAKE 1 TABLET(10 MG) BY MOUTH DAILY     Cardiovascular: Calcium Channel Blockers 2 Passed - 01/29/2022  3:48 AM      Passed - Last BP in normal range    BP Readings from Last 1 Encounters:  01/03/22 130/86         Passed - Last Heart  Rate in normal range    Pulse Readings from Last 1 Encounters:  01/03/22 74         Passed - Valid encounter within last 6 months    Recent Outpatient Visits          4 weeks ago Annual physical exam   [place] [facility] at [place], Earley Abide, MD   3 months ago Primary hypertension   [place] [facility] at [place], Earley Abide, MD   1 year ago Primary hypertension   [place] [facility] at [place], Earley Abide, MD   1 year ago Awakens from sleep at night   [place] and Sports Medicine at [place], Earley Abide, MD   1 year ago Primary hypertension   [place] [facility] at [place], Earley Abide, MD      Future Appointments            In 5 months Zigmund Daniel, Earley Abide, MD  [place] and Sports Medicine at [facility], [facility]

## 2022-07-04 ENCOUNTER — Encounter: Payer: Self-pay | Admitting: Family Medicine

## 2022-07-04 ENCOUNTER — Ambulatory Visit (INDEPENDENT_AMBULATORY_CARE_PROVIDER_SITE_OTHER): Payer: 59 | Admitting: Family Medicine

## 2022-07-04 VITALS — BP 124/82 | HR 98 | Ht 69.0 in | Wt 261.0 lb

## 2022-07-04 DIAGNOSIS — F419 Anxiety disorder, unspecified: Secondary | ICD-10-CM

## 2022-07-04 DIAGNOSIS — F32A Depression, unspecified: Secondary | ICD-10-CM

## 2022-07-04 DIAGNOSIS — R251 Tremor, unspecified: Secondary | ICD-10-CM | POA: Insufficient documentation

## 2022-07-04 DIAGNOSIS — I10 Essential (primary) hypertension: Secondary | ICD-10-CM

## 2022-07-04 DIAGNOSIS — F5232 Male orgasmic disorder: Secondary | ICD-10-CM | POA: Diagnosis not present

## 2022-07-04 DIAGNOSIS — G478 Other sleep disorders: Secondary | ICD-10-CM

## 2022-07-04 DIAGNOSIS — K2100 Gastro-esophageal reflux disease with esophagitis, without bleeding: Secondary | ICD-10-CM

## 2022-07-04 DIAGNOSIS — F5104 Psychophysiologic insomnia: Secondary | ICD-10-CM

## 2022-07-04 MED ORDER — METOPROLOL SUCCINATE ER 25 MG PO TB24: ORAL_TABLET | ORAL | 1 refills | Status: DC

## 2022-07-04 MED ORDER — AMLODIPINE BESYLATE 10 MG PO TABS: 10.0000 mg | ORAL_TABLET | Freq: Every day | ORAL | 1 refills | Status: DC

## 2022-07-04 NOTE — Assessment & Plan Note (Signed)
Scores are stable over interval visit, tolerating Trintellix well without issue, does bring up concern over anorgasmia, see additional assessment(s) for plan details.

## 2022-07-04 NOTE — Assessment & Plan Note (Signed)
In the setting of Trintellix usage, did discuss this being a possible adverse effect of the same.  Treatment strategies including increased physical activity, use of as needed sildenafil, or adjunct pharmacotherapy on a regular basis reviewed.  Patient will start with physical activity, will track this issue.

## 2022-07-04 NOTE — Assessment & Plan Note (Signed)
Chronic condition, well-controlled on current regimen, he denies any cardiopulmonary complaints.  At this stage have advised continued dosing of his current regimen, we will track this at his return.  Does have upcoming home sleep study which, if sleep apnea diagnosed, provides a modifiable risk factor.  Will follow results once available.

## 2022-07-04 NOTE — Assessment & Plan Note (Signed)
Home sleep study previously ordered, patient will pursue this, new order placed.

## 2022-07-04 NOTE — Patient Instructions (Addendum)
-   Review information attached - Increase cardiovascular physical activity - Home sleep study ordered, will contact with results once available - Will coordinate follow-up pending home sleep study - Contact us for any questions/concerns between now and then

## 2022-07-04 NOTE — Assessment & Plan Note (Signed)
Longstanding concern, describes that this is both at rest and with activity, has not correlated to sleep, caffeine, other intake.  Not observed during examination today.  Patient oriented materials have been provided, will focus on sleep component at this stage, we will track at return.

## 2022-07-04 NOTE — Progress Notes (Signed)
Primary Care / Sports Medicine Office Visit  Patient Information:  Patient ID: Jonathon Lozano, male DOB: 1980-10-18 Age: 42 y.o. MRN: [id number]   Jonathon Lozano is a pleasant 42 y.o. male presenting with the following:  Chief Complaint  Patient presents with   Hypertension   Shaking    Hands shaking    Gastroesophageal Reflux    Takes Pepcid     Vitals:   07/04/22 0852  BP: 124/82  Pulse: 98  SpO2: 98%   Vitals:   07/04/22 0852  Weight: 261 lb (118.4 kg)  Height: 5\' 9"  (1.753 m)   Body mass index is 38.54 kg/m.  No results found.   Independent interpretation of notes and tests performed by another provider:   None  Procedures performed:   None  Pertinent History, Exam, Impression, and Recommendations:   Thailan was seen today for hypertension, shaking and gastroesophageal reflux.  Primary hypertension Assessment & Plan: Chronic condition, well-controlled on current regimen, he denies any cardiopulmonary complaints.  At this stage have advised continued dosing of his current regimen, we will track this at his return.  Does have upcoming home sleep study which, if sleep apnea diagnosed, provides a modifiable risk factor.  Will follow results once available.  Orders: -     amLODIPine Besylate; Take 1 tablet (10 mg total) by mouth daily.  Dispense: 90 tablet; Refill: 1 -     Metoprolol Succinate ER; TAKE 1 TABLET(25 MG) BY MOUTH DAILY  Dispense: 90 tablet; Refill: 1  Tremor of both hands Assessment & Plan: Longstanding concern, describes that this is both at rest and with activity, has not correlated to sleep, caffeine, other intake.  Not observed during examination today.  Patient oriented materials have been provided, will focus on sleep component at this stage, we will track at return.   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  Treatment strategies  including increased physical activity, use of as needed sildenafil, or adjunct pharmacotherapy on a regular basis reviewed.  Patient will start with physical activity, will track this issue.   Gastroesophageal reflux disease with esophagitis without hemorrhage Assessment & Plan: Chronic condition, controlled with dietary modifications and as needed Pepcid.  We discussed treatment strategies and he will further focus on lifestyle modifications to minimize symptoms, patient oriented materials provided.   Anxiety and depression Assessment & Plan: Scores are stable over interval visit, tolerating Trintellix well without issue, does bring up concern over anorgasmia, see additional assessment(s) for plan details.    Awakens from sleep at night Assessment & Plan: Home sleep study previously ordered, patient will pursue this, new order placed.   Psychophysiological insomnia Assessment & Plan: See additional assessment(s) for plan details.  Home sleep study ordered.    I provided a total time of 47 minutes including both face-to-face and non-face-to-face time on 07/04/2022 inclusive of time utilized for medical chart review, information gathering, care coordination with staff, and documentation completion.   Orders & Medications Meds ordered this encounter  Medications   amLODipine (NORVASC) 10 MG tablet    Sig: Take 1 tablet (10 mg total) by mouth daily.    Dispense:  90 tablet    Refill:  1   metoprolol succinate (TOPROL-XL) 25 MG 24 hr tablet    Sig: TAKE 1 TABLET(25 MG) BY MOUTH DAILY    Dispense:  90 tablet    Refill:  1   No orders of the defined types were placed in this encounter.    No follow-ups on file.     Montel Culver, MD, [facility]   Primary Care Sports Medicine [facility] and [facility] Medicine at [facility]

## 2022-07-04 NOTE — Assessment & Plan Note (Addendum)
>>  ASSESSMENT AND PLAN FOR PSYCHOPHYSIOLOGICAL INSOMNIA WRITTEN ON 07/04/2022  1:06 PM BY Janaiyah Blackard J, MD  See additional assessment(s) for plan details.  Home sleep study ordered.   >>ASSESSMENT AND PLAN FOR AWAKENS FROM SLEEP AT NIGHT WRITTEN ON 07/04/2022  1:01 PM BY Ezreal Turay J, MD  Home sleep study previously ordered, patient will pursue this, new order placed.

## 2022-07-04 NOTE — Assessment & Plan Note (Signed)
Chronic condition, controlled with dietary modifications and as needed Pepcid.  We discussed treatment strategies and he will further focus on lifestyle modifications to minimize symptoms, patient oriented materials provided.

## 2022-07-08 IMAGING — CR DG CHEST 2V
1 series · 2 of 2 positions shown · non-contrast
Comparison: None.

CLINICAL DATA: left sided chest pressure that radiates into left
shoulder, reports mild shortness of breath.

EXAM:
CHEST - 2 VIEW

[Series 1: dg chest 2 view · 0.14mm/px · 2 of 2 slices shown]
[im 1/2]
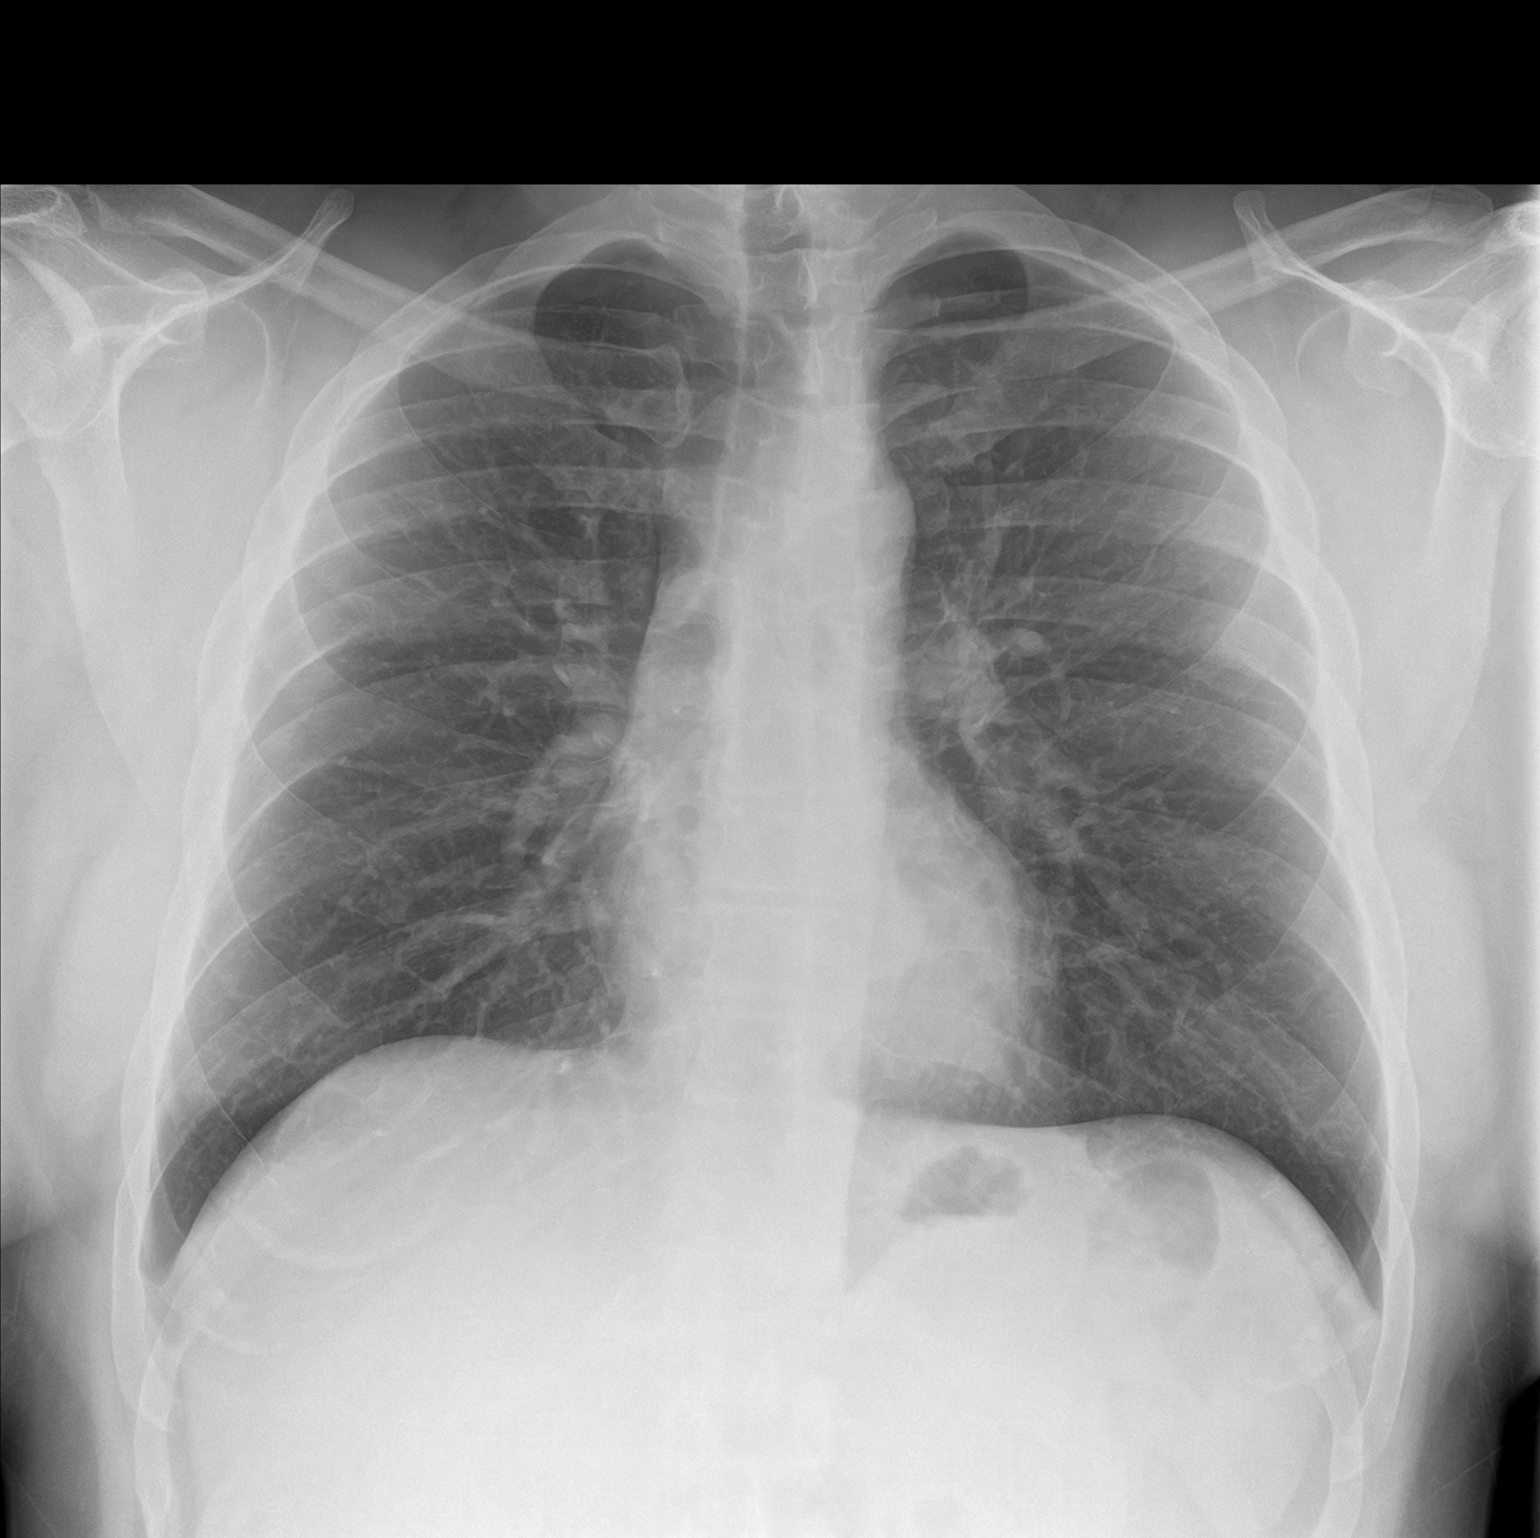
[im 2/2]
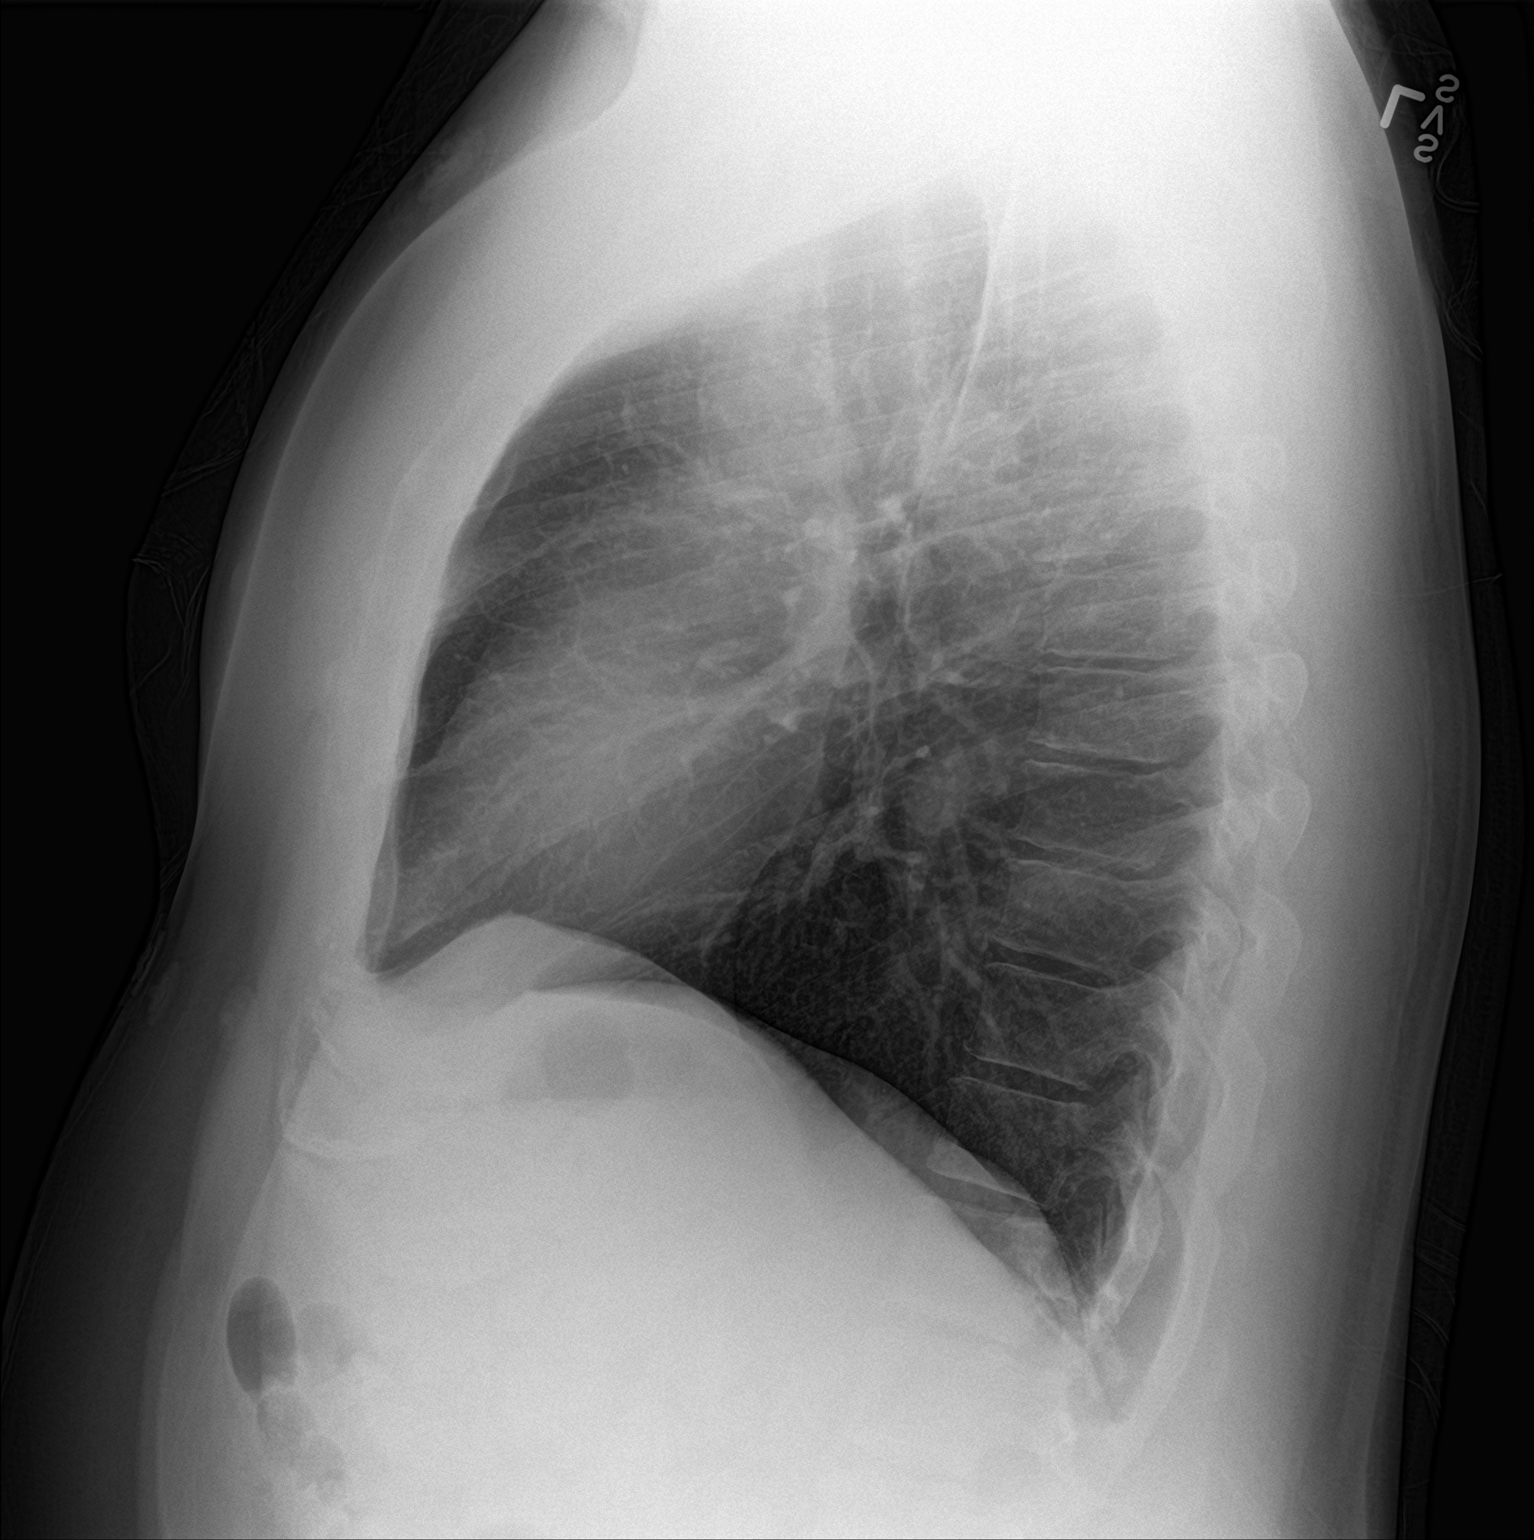

[2 of 2 positions shown; findings below may reference images not displayed]

FINDINGS: The heart size and mediastinal contours are within normal limits.

No focal consolidation. No pulmonary edema. No pleural effusion. No
pneumothorax.

No acute osseous abnormality.
IMPRESSION: No active cardiopulmonary disease.

## 2022-12-09 ENCOUNTER — Emergency Department
Admission: EM | Admit: 2022-12-09 | Discharge: 2022-12-09 | Disposition: A | Discharge status: Home / Self Care | Payer: 59 | Attending: Emergency Medicine | Admitting: Emergency Medicine

## 2022-12-09 ENCOUNTER — Emergency Department: Payer: 59

## 2022-12-09 ENCOUNTER — Telehealth: Payer: Self-pay

## 2022-12-09 ENCOUNTER — Other Ambulatory Visit: Payer: Self-pay

## 2022-12-09 DIAGNOSIS — I10 Essential (primary) hypertension: Secondary | ICD-10-CM | POA: Diagnosis not present

## 2022-12-09 DIAGNOSIS — Z20822 Contact with and (suspected) exposure to covid-19: Secondary | ICD-10-CM | POA: Insufficient documentation

## 2022-12-09 DIAGNOSIS — K8020 Calculus of gallbladder without cholecystitis without obstruction: Secondary | ICD-10-CM | POA: Insufficient documentation

## 2022-12-09 DIAGNOSIS — R1010 Upper abdominal pain, unspecified: Secondary | ICD-10-CM

## 2022-12-09 DIAGNOSIS — R1011 Right upper quadrant pain: Secondary | ICD-10-CM | POA: Diagnosis present

## 2022-12-09 DIAGNOSIS — K8050 Calculus of bile duct without cholangitis or cholecystitis without obstruction: Secondary | ICD-10-CM

## 2022-12-09 LAB — CBC
HCT: 43.9 % (ref 39.0–52.0)
Hemoglobin: 14.9 g/dL (ref 13.0–17.0)
MCH: 28.6 pg (ref 26.0–34.0)
MCHC: 33.9 g/dL (ref 30.0–36.0)
MCV: 84.3 fL (ref 80.0–100.0)
Platelets: 319 10*3/uL (ref 150–400)
RBC: 5.21 MIL/uL (ref 4.22–5.81)
RDW: 12.9 % (ref 11.5–15.5)
WBC: 5.9 10*3/uL (ref 4.0–10.5)
nRBC: 0 % (ref 0.0–0.2)

## 2022-12-09 LAB — URINALYSIS, ROUTINE W REFLEX MICROSCOPIC
Bacteria, UA: NONE SEEN
Bilirubin Urine: NEGATIVE
Glucose, UA: 50 mg/dL — AB
Ketones, ur: NEGATIVE mg/dL
Leukocytes,Ua: NEGATIVE
Nitrite: NEGATIVE
Protein, ur: NEGATIVE mg/dL
Specific Gravity, Urine: 1.014 (ref 1.005–1.030)
pH: 5 (ref 5.0–8.0)

## 2022-12-09 LAB — COMPREHENSIVE METABOLIC PANEL
ALT: 28 U/L (ref 0–44)
AST: 25 U/L (ref 15–41)
Albumin: 4 g/dL (ref 3.5–5.0)
Alkaline Phosphatase: 129 U/L — ABNORMAL HIGH (ref 38–126)
Anion gap: 12 (ref 5–15)
BUN: 11 mg/dL (ref 6–20)
CO2: 27 mmol/L (ref 22–32)
Calcium: 9.5 mg/dL (ref 8.9–10.3)
Chloride: 103 mmol/L (ref 98–111)
Creatinine, Ser: 1.11 mg/dL (ref 0.61–1.24)
GFR, Estimated: >60 mL/min (ref >60)
Glucose, Bld: 164 mg/dL — ABNORMAL HIGH (ref 70–99)
Potassium: 3.8 mmol/L (ref 3.5–5.1)
Sodium: 142 mmol/L (ref 135–145)
Total Bilirubin: 0.5 mg/dL (ref 0.3–1.2)
Total Protein: 8 g/dL (ref 6.5–8.1)

## 2022-12-09 LAB — LIPASE, BLOOD: Lipase: 40 U/L (ref 11–51)

## 2022-12-09 LAB — SARS CORONAVIRUS 2 BY RT PCR: SARS Coronavirus 2 by RT PCR: NEGATIVE

## 2022-12-09 MED ORDER — MORPHINE SULFATE (PF) 4 MG/ML IV SOLN
4.0000 mg | Freq: Once | INTRAVENOUS | Status: AC
  Administered 2022-12-09: 4 mg via INTRAVENOUS
  Filled 2022-12-09: qty 1

## 2022-12-09 MED ORDER — HYDROCODONE-ACETAMINOPHEN 5-325 MG PO TABS: 1.0000 | ORAL_TABLET | Freq: Four times a day (QID) | ORAL | 0 refills | Status: DC | PRN

## 2022-12-09 MED ORDER — SODIUM CHLORIDE 0.9 % IV BOLUS
1000.0000 mL | Freq: Once | INTRAVENOUS | Status: AC
  Administered 2022-12-09: 1000 mL via INTRAVENOUS

## 2022-12-09 MED ORDER — ONDANSETRON HCL 4 MG/2ML IJ SOLN
4.0000 mg | Freq: Once | INTRAMUSCULAR | Status: AC
  Administered 2022-12-09: 4 mg via INTRAVENOUS
  Filled 2022-12-09: qty 2

## 2022-12-09 MED ORDER — ONDANSETRON 4 MG PO TBDP: 4.0000 mg | ORAL_TABLET | Freq: Three times a day (TID) | ORAL | 0 refills | Status: DC | PRN

## 2022-12-09 NOTE — Transitions of Care (Post Inpatient/ED Visit) (Signed)
   12/09/2022  Name: Jonathon Lozano MRN: [id number] DOB: 08/02/1980  Today's TOC FU Call Status: Today's TOC FU Call Status:: Unsuccessful Call (1st Attempt) Unsuccessful Call (1st Attempt) Date: 12/09/22  Attempted to reach the patient regarding the most recent Inpatient/ED visit.  Follow Up Plan: Additional outreach attempts will be made to reach the patient to complete the Transitions of Care (Post Inpatient/ED visit) call.   Signature Arthur Holms

## 2022-12-09 NOTE — ED Triage Notes (Signed)
Pt here with RLQ abd pain that started this morning. Pt states he normally walks and that helps with his abd pain but this time it did not five any relief. Pt states his pain started in the upper region and radiates upwards. Pt endorses nausea but has not vomited.

## 2022-12-09 NOTE — Discharge Instructions (Signed)
Follow the latter eating plan.  Follow-up with Dr. Tonna Boehringer.  Call his office for an appointment.  Return emergency department worsening.  Uses Zofran ODT for nausea as needed.  Percocet for pain if needed.

## 2022-12-09 NOTE — ED Provider Notes (Signed)
[facility] Provider Note    Event Date/Time   First MD Initiated Contact with Patient 12/09/22 [phone]     (approximate)   History   Abdominal Pain   HPI  Jonathon Lozano is a 42 y.o. male with history of hypertension presents to the emergency department with right upper quadrant pain that radiates into his shoulder.  Patient states pain started this morning.  Last night he ate chicken tenders fries and a peanut butter sandwich.  Some nausea but no vomiting.  States he felt hot and sweaty with the pain.  Patient denies diarrhea.  No sick contacts.  Does still have gallbladder and appendix      Physical Exam   Triage Vital Signs: ED Triage Vitals [12/09/22 0749]  Encounter Vitals Group     BP (!) 153/101     Systolic BP Percentile      Diastolic BP Percentile      Pulse Rate 87     Resp 18     Temp 97.8 F (36.6 C)     Temp Source Oral     SpO2 100 %     Weight 261 lb 0.4 oz (118.4 kg)     Height 5\' 9"  (1.753 m)     Head Circumference      Peak Flow      Pain Score 8     Pain Loc      Pain Education      Exclude from Growth Chart     Most recent vital signs: Vitals:   12/09/22 0749  BP: (!) 153/101  Pulse: 87  Resp: 18  Temp: 97.8 F (36.6 C)  SpO2: 100%     General: Awake, no distress.   CV:  Good peripheral perfusion. regular rate and  rhythm Resp:  Normal effort. Lungs cta Abd:  No distention.  Tender in the right upper quadrant, bowel sounds normal all 4 quads, no tenderness in the right lower quadrant Other:      ED Results / Procedures / Treatments   Labs (all labs ordered are listed, but only abnormal results are displayed) Labs Reviewed  COMPREHENSIVE METABOLIC PANEL - Abnormal; Notable for the following components:      Result Value   Glucose, Bld 164 (*)    Alkaline Phosphatase 129 (*)    All other components within normal limits  URINALYSIS, ROUTINE W REFLEX MICROSCOPIC - Abnormal; Notable for the following  components:   Color, Urine STRAW (*)    APPearance CLEAR (*)    Glucose, UA 50 (*)    Hgb urine dipstick SMALL (*)    All other components within normal limits  SARS CORONAVIRUS 2 BY RT PCR  LIPASE, BLOOD  CBC     EKG     RADIOLOGY Ultrasound right upper quadrant    PROCEDURES:   Procedures   MEDICATIONS ORDERED IN ED: Medications  sodium chloride 0.9 % bolus 1,000 mL (0 mLs Intravenous Stopped 12/09/22 1102)  ondansetron (ZOFRAN) injection 4 mg (4 mg Intravenous Given 12/09/22 0948)  morphine (PF) 4 MG/ML injection 4 mg (4 mg Intravenous Given 12/09/22 0948)     IMPRESSION / MDM / ASSESSMENT AND PLAN / ED COURSE  I reviewed the triage vital signs and the nursing notes.                              Differential diagnosis includes, but is not limited  to, acute cholecystitis, cholelithiasis, pancreatitis, bowel obstruction, PUD, acute appendicitis, kidney stone  Patient's presentation is most consistent with acute illness / injury with system symptoms.   CBC, lipase and urinalysis are reassuring, metabolic panel has increased his out phosphatase which is minimal at 129, glucose also elevated 164  Ultrasound right upper quadrant to assess for acute cholecystitis  Patient was given normal saline 1 L IV, morphine 4 mg IV and Zofran 4 mg IV   Ultrasound independently reviewed interpreted by me, shows distended gallbladder with gallstones and mild thickening of the wall of the gallbladder.  Consult surgery, question if need further imaging or okay to go to the OR?  Consult to Dr. Geoffery Lyons from surgery, states agrees with the ultrasound report.  States if the patient's pain is controlled we may treat outpatient.  If not we will admit for surgery.  The patient's pain is controlled.  He is feeling better.  Did discuss and offer admission.  Patient prefers to follow-up as outpatient for outpatient surgery.  In shared decision making we are both agreeable to this treatment  plan.  Patient was given a prescription for Zofran ODT, Percocet for pain if needed.  He was instructed to eat low-fat low-carb diet.  He was given a printout of a gallbladder eating plan.  Call Dr. Geoffery Lyons office for an appointment.  Patient was discharged stable condition.   FINAL CLINICAL IMPRESSION(S) / ED DIAGNOSES   Final diagnoses:  Biliary colic  Pain of upper abdomen     Rx / DC Orders   ED Discharge Orders          Ordered    ondansetron (ZOFRAN-ODT) 4 MG disintegrating tablet  Every 8 hours PRN        12/09/22 1316    HYDROcodone-acetaminophen (NORCO/VICODIN) 5-325 MG tablet  Every 6 hours PRN        12/09/22 1316             Note:  This document was prepared using Dragon voice recognition software and may include unintentional dictation errors.    Faythe Ghee, PA-C 12/09/22 1320    Chesley Noon, MD 12/09/22 [phone]

## 2022-12-12 ENCOUNTER — Telehealth: Payer: Self-pay | Admitting: Family Medicine

## 2022-12-12 ENCOUNTER — Telehealth: Payer: Self-pay

## 2022-12-12 NOTE — Telephone Encounter (Signed)
Copied from CRM [id number]. Topic: General - Other >> Dec 12, 2022  1:52 PM Everette C wrote: Reason for CRM: The patient has returned a missed call from T. Lynch   Please contact further when possible

## 2022-12-12 NOTE — Transitions of Care (Post Inpatient/ED Visit) (Signed)
   12/12/2022  Name: Jonathon Lozano MRN: [id number] DOB: Jan 07, 1981  Today's TOC FU Call Status: Today's TOC FU Call Status:: Successful TOC FU Call Completed Unsuccessful Call (2nd Attempt) Date: 12/12/22 [facility] FU Call Complete Date: 12/12/22 Patient's Name and Date of Birth confirmed.  Transition Care Management Follow-up Telephone Call Date of Discharge: 12/09/22 Discharge Facility: [facility]) Type of Discharge: Emergency Department Reason for ED Visit: Other: (abd pain) How have you been since you were released from the hospital?: Better Any questions or concerns?: No  Items Reviewed: Did you receive and understand the discharge instructions provided?: Yes Medications obtained,verified, and reconciled?: Yes (Medications Reviewed) Any new allergies since your discharge?: No Dietary orders reviewed?: NA Do you have support at home?: Yes People in Home: spouse Name of Support/Comfort Primary Source: Keenan Bachelor  Medications Reviewed Today: Medications Reviewed Today     Reviewed by Everitt Amber (Medical Assistant) on 12/12/22 at 1453  Med List Status: <None>   Medication Order Taking? Sig Documenting Provider Last Dose Status Informant  amLODipine (NORVASC) 10 MG tablet [id number] Yes Take 1 tablet (10 mg total) by mouth daily. Jerrol Banana, MD Taking Active   cetirizine (ZYRTEC) 10 MG tablet [id number] Yes Take 10 mg by mouth daily. [provider] Taking Active   HYDROcodone-acetaminophen (NORCO/VICODIN) 5-325 MG tablet [id number] Yes Take 1 tablet by mouth every 6 (six) hours as needed for moderate pain. Faythe Ghee, PA-C Taking Active   melatonin 5 MG TABS [id number] Yes Take 5 mg by mouth at bedtime. [provider] Taking Active   metoprolol succinate (TOPROL-XL) 25 MG 24 hr tablet [id number] Yes TAKE 1 TABLET(25 MG) BY MOUTH DAILY Jerrol Banana, MD Taking Active   ondansetron (ZOFRAN-ODT) 4 MG disintegrating tablet  [id number] Yes Take 1 tablet (4 mg total) by mouth every 8 (eight) hours as needed. Faythe Ghee, PA-C Taking Active   vortioxetine HBr (TRINTELLIX) 20 MG TABS tablet [id number] Yes Take 1 tablet (20 mg total) by mouth daily. Jerrol Banana, MD Taking Active             Home Care and Equipment/Supplies: Were Home Health Services Ordered?: NA Any new equipment or medical supplies ordered?: NA  Functional Questionnaire: Do you need assistance with bathing/showering or dressing?: No Do you need assistance with meal preparation?: No Do you need assistance with eating?: No Do you have difficulty maintaining continence: No Do you need assistance with getting out of bed/getting out of a chair/moving?: No Do you have difficulty managing or taking your medications?: No  Follow up appointments reviewed: PCP Follow-up appointment confirmed?: Yes Date of PCP follow-up appointment?: 12/15/22 Follow-up Provider: [facility] Follow-up appointment confirmed?: Yes Date of Specialist follow-up appointment?: 12/13/22 Follow-Up Specialty Provider:: Dr Renee Ramus Do you need transportation to your follow-up appointment?: No Do you understand care options if your condition(s) worsen?: Yes-patient verbalized understanding    SIGNATURE Arthur Holms

## 2022-12-13 ENCOUNTER — Ambulatory Visit: Payer: Self-pay | Admitting: Surgery

## 2022-12-13 NOTE — H&P (View-Only) (Signed)
Subjective:   CC: Biliary colic [K80.50]  HPI:  Jonathon Lozano is a 42 y.o. male who was referred by [facility]* for evaluation of above CC. Symptoms were first noted a few weeks ago. Pain is dull and intermittent, confined to the right upper quadrant, without radiation.  Associated with nothing specific, exacerbated by nothing specific.     Past Medical History:  has a past medical history of High blood pressure.  Past Surgical History:  has no past surgical history on file.  Family History: family history includes Colon cancer in his maternal uncle; High blood pressure (Hypertension) in his father and mother; Pancreatic cancer in his paternal grandmother.  Social History:  reports that he has never smoked. He has never used smokeless tobacco. No history on file for alcohol use and drug use.  Current Medications: has a current medication list which includes the following prescription(s): amlodipine, cetirizine, diphenhydramine, hydrocodone-acetaminophen, melatonin-pyridoxine (vit b6), metoprolol succinate, and vortioxetine.  Allergies:  Allergies as of 12/13/2022 - Reviewed 12/13/2022  Allergen Reaction Noted   Hay fever & allergy relief [chlorpheniramine-phenylpropan] Other (See Comments) 12/13/2022    ROS:  A 15 point review of systems was performed and pertinent positives and negatives noted in HPI    Objective:     BP 121/87   Pulse 71   Ht 175.3 cm (5\' 9" )   Wt (!) 117.9 kg (260 lb)   BMI 38.40 kg/m    Constitutional :  No distress, cooperative, alert  Lymphatics/Throat:  Supple with no lymphadenopathy  Respiratory:  Clear to auscultation bilaterally  Cardiovascular:  Regular rate and rhythm  Gastrointestinal: Soft, non-tender, non-distended, no organomegaly.  Musculoskeletal: Steady gait and movement  Skin: Cool and moist  Psychiatric: Normal affect, non-agitated, not confused       LABS:  Elevated AP of 129  RADS: CLINICAL DATA:  Right upper  quadrant abdominal pain   EXAM:  ULTRASOUND ABDOMEN LIMITED RIGHT UPPER QUADRANT   COMPARISON:  None Available.   FINDINGS:  Gallbladder:   Distended gallbladder with multiple small stones. No adjacent fluid.  Borderline wall thickening of 3 mm.   Common bile duct:   Diameter: 4 mm   Liver:   Diffusely echogenic hepatic parenchyma consistent with fatty liver  infiltration. With this level of echogenicity evaluation for  underlying mass lesion is limited and if needed follow-up contrast  CT or MRI as clinically directed. Portal vein is patent on color  Doppler imaging with normal direction of blood flow towards the  liver.   Other: None.   IMPRESSION:  Distended gallbladder with multiple stones and borderline wall  thickening. No ductal dilatation. Please correlate for other  clinical evidence of acute cholecystitis. If needed confirmatory  study with a HIDA scan could be considered.   Fatty liver infiltration    Electronically Signed    By: Karen Kays M.D.    On: 12/09/2022 10:32  Assessment:      Biliary colic [K80.50]- hx consistent with above findings.  Plan:     1. Biliary colic [K80.50] Discussed the risk of surgery including post-op infxn, seroma, biloma, chronic pain, poor-delayed wound healing, retained gallstone, conversion to open procedure, post-op SBO or ileus, and need for additional procedures to address said risks.  The risks of general anesthetic including MI, CVA, sudden death or even reaction to anesthetic medications also discussed. Alternatives include continued observation.  Benefits include possible symptom relief, prevention of complications including acute cholecystitis, pancreatitis.  Typical post operative recovery  of 3-5 days rest, continued pain in area and incision sites, possible loose stools up to 4-6 weeks, also discussed.  ED return precautions given for sudden increase in RUQ pain, with possible accompanying fever, nausea,  and/or vomiting.  The patient understands the risks, any and all questions were answered to the patient's satisfaction.  2. Patient has elected to proceed with surgical treatment. Procedure will be scheduled.  Written consent was obtained.robotic assisted laparoscopic  labs/images/medications/previous chart entries reviewed personally and relevant changes/updates noted above.

## 2022-12-13 NOTE — H&P (Signed)
Subjective:   CC: Biliary colic [K80.50]  HPI:  Jonathon Lozano is a 42 y.o. male who was referred by [facility]* for evaluation of above CC. Symptoms were first noted a few weeks ago. Pain is dull and intermittent, confined to the right upper quadrant, without radiation.  Associated with nothing specific, exacerbated by nothing specific.     Past Medical History:  has a past medical history of High blood pressure.  Past Surgical History:  has no past surgical history on file.  Family History: family history includes Colon cancer in his maternal uncle; High blood pressure (Hypertension) in his father and mother; Pancreatic cancer in his paternal grandmother.  Social History:  reports that he has never smoked. He has never used smokeless tobacco. No history on file for alcohol use and drug use.  Current Medications: has a current medication list which includes the following prescription(s): amlodipine, cetirizine, diphenhydramine, hydrocodone-acetaminophen, melatonin-pyridoxine (vit b6), metoprolol succinate, and vortioxetine.  Allergies:  Allergies as of 12/13/2022 - Reviewed 12/13/2022  Allergen Reaction Noted   Hay fever & allergy relief [chlorpheniramine-phenylpropan] Other (See Comments) 12/13/2022    ROS:  A 15 point review of systems was performed and pertinent positives and negatives noted in HPI    Objective:     BP 121/87   Pulse 71   Ht 175.3 cm (5\' 9" )   Wt (!) 117.9 kg (260 lb)   BMI 38.40 kg/m    Constitutional :  No distress, cooperative, alert  Lymphatics/Throat:  Supple with no lymphadenopathy  Respiratory:  Clear to auscultation bilaterally  Cardiovascular:  Regular rate and rhythm  Gastrointestinal: Soft, non-tender, non-distended, no organomegaly.  Musculoskeletal: Steady gait and movement  Skin: Cool and moist  Psychiatric: Normal affect, non-agitated, not confused       LABS:  Elevated AP of 129  RADS: CLINICAL DATA:  Right upper  quadrant abdominal pain   EXAM:  ULTRASOUND ABDOMEN LIMITED RIGHT UPPER QUADRANT   COMPARISON:  None Available.   FINDINGS:  Gallbladder:   Distended gallbladder with multiple small stones. No adjacent fluid.  Borderline wall thickening of 3 mm.   Common bile duct:   Diameter: 4 mm   Liver:   Diffusely echogenic hepatic parenchyma consistent with fatty liver  infiltration. With this level of echogenicity evaluation for  underlying mass lesion is limited and if needed follow-up contrast  CT or MRI as clinically directed. Portal vein is patent on color  Doppler imaging with normal direction of blood flow towards the  liver.   Other: None.   IMPRESSION:  Distended gallbladder with multiple stones and borderline wall  thickening. No ductal dilatation. Please correlate for other  clinical evidence of acute cholecystitis. If needed confirmatory  study with a HIDA scan could be considered.   Fatty liver infiltration    Electronically Signed    By: Karen Kays M.D.    On: 12/09/2022 10:32  Assessment:      Biliary colic [K80.50]- hx consistent with above findings.  Plan:     1. Biliary colic [K80.50] Discussed the risk of surgery including post-op infxn, seroma, biloma, chronic pain, poor-delayed wound healing, retained gallstone, conversion to open procedure, post-op SBO or ileus, and need for additional procedures to address said risks.  The risks of general anesthetic including MI, CVA, sudden death or even reaction to anesthetic medications also discussed. Alternatives include continued observation.  Benefits include possible symptom relief, prevention of complications including acute cholecystitis, pancreatitis.  Typical post operative recovery  of 3-5 days rest, continued pain in area and incision sites, possible loose stools up to 4-6 weeks, also discussed.  ED return precautions given for sudden increase in RUQ pain, with possible accompanying fever, nausea,  and/or vomiting.  The patient understands the risks, any and all questions were answered to the patient's satisfaction.  2. Patient has elected to proceed with surgical treatment. Procedure will be scheduled.  Written consent was obtained.robotic assisted laparoscopic  labs/images/medications/previous chart entries reviewed personally and relevant changes/updates noted above.

## 2022-12-15 ENCOUNTER — Ambulatory Visit: Payer: 59 | Admitting: Family Medicine

## 2022-12-20 ENCOUNTER — Other Ambulatory Visit: Payer: Self-pay

## 2022-12-20 ENCOUNTER — Encounter
Admission: RE | Admit: 2022-12-20 | Discharge: 2022-12-20 | Disposition: A | Discharge status: Home / Self Care | Payer: 59 | Source: Ambulatory Visit | Attending: Surgery | Admitting: Surgery

## 2022-12-20 ENCOUNTER — Ambulatory Visit: Payer: 59 | Admitting: Family Medicine

## 2022-12-20 DIAGNOSIS — I10 Essential (primary) hypertension: Secondary | ICD-10-CM

## 2022-12-20 HISTORY — DX: Unspecified asthma, uncomplicated: J45.909

## 2022-12-20 HISTORY — DX: Gastro-esophageal reflux disease without esophagitis: K21.9

## 2022-12-20 HISTORY — DX: Essential (primary) hypertension: I10

## 2022-12-20 HISTORY — DX: Hyperlipidemia, unspecified: E78.5

## 2022-12-20 HISTORY — DX: Tremor, unspecified: R25.1

## 2022-12-20 NOTE — Patient Instructions (Addendum)
Your procedure is scheduled on: 12/23/22 - Friday Report to the Registration Desk on the 1st floor of the [facility]. To find out your arrival time, please call [phone] between 1PM - 3PM on: 12/22/22 - Thursday If your arrival time is 6:00 am, do not arrive before that time as the [facility] entrance doors do not open until 6:00 am.  REMEMBER: Instructions that are not followed completely may result in serious medical risk, up to and including death; or upon the discretion of your surgeon and anesthesiologist your surgery may need to be rescheduled.  Do not eat food after midnight the night before surgery.  No gum chewing or hard candies.  You may however, drink CLEAR liquids up to 2 hours before you are scheduled to arrive for your surgery. Do not drink anything within 2 hours of your scheduled arrival time.  Clear liquids include: - water  - apple juice without pulp - gatorade (not RED colors) - black coffee or tea (Do NOT add milk or creamers to the coffee or tea) Do NOT drink anything that is not on this list.   One week prior to surgery: Stop Anti-inflammatories (NSAIDS) such as Advil, Aleve, Ibuprofen, Motrin, Naproxen, Naprosyn and Aspirin based products such as Excedrin, Goody's Powder, BC Powder. You may however, continue to take Tylenol if needed for pain up until the day of surgery.  Stop ANY OVER THE COUNTER supplements until after surgery.   TAKE ONLY THESE MEDICATIONS THE MORNING OF SURGERY WITH A SIP OF WATER:  amLODipine (NORVASC)  metoprolol succinate (TOPROL-XL)  vortioxetine HBr (TRINTELLIX)  Pepcid Complete take 1 tablet the night before surgery and again the morning of.   No Alcohol for 24 hours before or after surgery.  No Smoking including e-cigarettes for 24 hours before surgery.  No chewable tobacco products for at least 6 hours before surgery.  No nicotine patches on the day of surgery.  Do not use any "recreational" drugs for at least a  week (preferably 2 weeks) before your surgery.  Please be advised that the combination of cocaine and anesthesia may have negative outcomes, up to and including death. If you test positive for cocaine, your surgery will be cancelled.  On the morning of surgery brush your teeth with toothpaste and water, you may rinse your mouth with mouthwash if you wish. Do not swallow any toothpaste or mouthwash.  Use CHG Soap or wipes as directed on instruction sheet.  Do not wear jewelry, make-up, hairpins, clips or nail polish.  Do not wear lotions, powders, or perfumes.   Do not shave body hair from the neck down 48 hours before surgery.  Contact lenses, hearing aids and dentures may not be worn into surgery.  Do not bring valuables to the hospital. [facility] is not responsible for any missing/lost belongings or valuables.   Notify your doctor if there is any change in your medical condition (cold, fever, infection).  Wear comfortable clothing (specific to your surgery type) to the hospital.  After surgery, you can help prevent lung complications by doing breathing exercises.  Take deep breaths and cough every 1-2 hours. Your doctor may order a device called an Incentive Spirometer to help you take deep breaths. When coughing or sneezing, hold a pillow firmly against your incision with both hands. This is called "splinting." Doing this helps protect your incision. It also decreases belly discomfort.  If you are being admitted to the hospital overnight, leave your suitcase in the car.  After surgery it may be brought to your room.  In case of increased patient census, it may be necessary for you, the patient, to continue your postoperative care in the Same Day Surgery department.  If you are being discharged the day of surgery, you will not be allowed to drive home. You will need a responsible individual to drive you home and stay with you for 24 hours after surgery.   If you are taking public  transportation, you will need to have a responsible individual with you.  Please call the Pre-admissions Testing Dept. at [phone] if you have any questions about these instructions.  Surgery Visitation Policy:  Patients having surgery or a procedure may have two visitors.  Children under the age of 20 must have an adult with them who is not the patient.  Inpatient Visitation:    Visiting hours are 7 a.m. to 8 p.m. Up to four visitors are allowed at one time in a patient room. The visitors may rotate out with other people during the day.  One visitor age 41 or older may stay with the patient overnight and must be in the room by 8 p.m.    Preparing for Surgery with CHLORHEXIDINE GLUCONATE (CHG) Soap  Chlorhexidine Gluconate (CHG) Soap  o An antiseptic cleaner that kills germs and bonds with the skin to continue killing germs even after washing  o Used for showering the night before surgery and morning of surgery  Before surgery, you can play an important role by reducing the number of germs on your skin.  CHG (Chlorhexidine gluconate) soap is an antiseptic cleanser which kills germs and bonds with the skin to continue killing germs even after washing.  Please do not use if you have an allergy to CHG or antibacterial soaps. If your skin becomes reddened/irritated stop using the CHG.  1. Shower the NIGHT BEFORE SURGERY and the MORNING OF SURGERY with CHG soap.  2. If you choose to wash your hair, wash your hair first as usual with your normal shampoo.  3. After shampooing, rinse your hair and body thoroughly to remove the shampoo.  4. Use CHG as you would any other liquid soap. You can apply CHG directly to the skin and wash gently with a scrungie or a clean washcloth.  5. Apply the CHG soap to your body only from the neck down. Do not use on open wounds or open sores. Avoid contact with your eyes, ears, mouth, and genitals (private parts). Wash face and genitals (private  parts) with your normal soap.  6. Wash thoroughly, paying special attention to the area where your surgery will be performed.  7. Thoroughly rinse your body with warm water.  8. Do not shower/wash with your normal soap after using and rinsing off the CHG soap.  9. Pat yourself dry with a clean towel.  10. Wear clean pajamas to bed the night before surgery.  12. Place clean sheets on your bed the night of your first shower and do not sleep with pets.  13. Shower again with the CHG soap on the day of surgery prior to arriving at the hospital.  14. Do not apply any deodorants/lotions/powders.  15. Please wear clean clothes to the hospital.

## 2022-12-21 ENCOUNTER — Encounter
Admission: RE | Admit: 2022-12-21 | Discharge: 2022-12-21 | Disposition: A | Discharge status: Home / Self Care | Payer: 59 | Source: Ambulatory Visit | Attending: Surgery | Admitting: Surgery

## 2022-12-21 DIAGNOSIS — Z01810 Encounter for preprocedural cardiovascular examination: Secondary | ICD-10-CM | POA: Diagnosis present

## 2022-12-21 DIAGNOSIS — I10 Essential (primary) hypertension: Secondary | ICD-10-CM | POA: Diagnosis not present

## 2022-12-23 ENCOUNTER — Encounter: Payer: Self-pay | Admitting: Surgery

## 2022-12-23 ENCOUNTER — Ambulatory Visit: Payer: 59 | Admitting: Urgent Care

## 2022-12-23 ENCOUNTER — Encounter
Admission: RE | Disposition: A | Discharge status: Home / Self Care | Payer: Self-pay | Source: Home / Self Care | Attending: Surgery

## 2022-12-23 ENCOUNTER — Ambulatory Visit: Payer: 59 | Admitting: Certified Registered"

## 2022-12-23 ENCOUNTER — Ambulatory Visit
Admission: RE | Admit: 2022-12-23 | Discharge: 2022-12-23 | Disposition: A | Discharge status: Home / Self Care | Payer: 59 | Attending: Surgery | Admitting: Surgery

## 2022-12-23 ENCOUNTER — Other Ambulatory Visit: Payer: Self-pay

## 2022-12-23 DIAGNOSIS — I10 Essential (primary) hypertension: Secondary | ICD-10-CM | POA: Diagnosis not present

## 2022-12-23 DIAGNOSIS — Z87891 Personal history of nicotine dependence: Secondary | ICD-10-CM | POA: Diagnosis not present

## 2022-12-23 DIAGNOSIS — K8044 Calculus of bile duct with chronic cholecystitis without obstruction: Secondary | ICD-10-CM | POA: Insufficient documentation

## 2022-12-23 SURGERY — CHOLECYSTECTOMY, ROBOT-ASSISTED, LAPAROSCOPIC
Anesthesia: General | Site: Abdomen

## 2022-12-23 MED ORDER — FENTANYL CITRATE (PF) 250 MCG/5ML IJ SOLN
INTRAMUSCULAR | Status: AC
  Filled 2022-12-23: qty 5

## 2022-12-23 MED ORDER — CHLORHEXIDINE GLUCONATE CLOTH 2 % EX PADS
6.0000 | MEDICATED_PAD | Freq: Once | CUTANEOUS | Status: AC
  Administered 2022-12-23: 6 via TOPICAL

## 2022-12-23 MED ORDER — LIDOCAINE-EPINEPHRINE (PF) 1 %-1:200000 IJ SOLN
INTRAMUSCULAR | Status: DC | PRN
  Administered 2022-12-23: 20 mL via INTRAMUSCULAR

## 2022-12-23 MED ORDER — BUPIVACAINE HCL (PF) 0.5 % IJ SOLN
INTRAMUSCULAR | Status: AC
  Filled 2022-12-23: qty 30

## 2022-12-23 MED ORDER — PROPOFOL 10 MG/ML IV BOLUS
INTRAVENOUS | Status: DC | PRN
  Administered 2022-12-23: 200 mg via INTRAVENOUS

## 2022-12-23 MED ORDER — LIDOCAINE HCL (CARDIAC) PF 100 MG/5ML IV SOSY
PREFILLED_SYRINGE | INTRAVENOUS | Status: DC | PRN
  Administered 2022-12-23: 100 mg via INTRAVENOUS

## 2022-12-23 MED ORDER — SUGAMMADEX SODIUM 200 MG/2ML IV SOLN
INTRAVENOUS | Status: DC | PRN
  Administered 2022-12-23: 200 mg via INTRAVENOUS

## 2022-12-23 MED ORDER — OXYCODONE HCL 5 MG/5ML PO SOLN: 5.0000 mg | Freq: Once | ORAL | Status: AC | PRN

## 2022-12-23 MED ORDER — FENTANYL CITRATE (PF) 100 MCG/2ML IJ SOLN: 25.0000 ug | INTRAMUSCULAR | Status: DC | PRN

## 2022-12-23 MED ORDER — MIDAZOLAM HCL 2 MG/2ML IJ SOLN
INTRAMUSCULAR | Status: DC | PRN
  Administered 2022-12-23: 2 mg via INTRAVENOUS

## 2022-12-23 MED ORDER — ORAL CARE MOUTH RINSE: 15.0000 mL | Freq: Once | OROMUCOSAL | Status: AC

## 2022-12-23 MED ORDER — ONDANSETRON HCL 4 MG/2ML IJ SOLN
INTRAMUSCULAR | Status: DC | PRN
  Administered 2022-12-23: 4 mg via INTRAVENOUS

## 2022-12-23 MED ORDER — OXYCODONE HCL 5 MG PO TABS
5.0000 mg | ORAL_TABLET | Freq: Once | ORAL | Status: AC | PRN
  Administered 2022-12-23: 5 mg via ORAL

## 2022-12-23 MED ORDER — ROCURONIUM BROMIDE 10 MG/ML (PF) SYRINGE
PREFILLED_SYRINGE | INTRAVENOUS | Status: AC
  Filled 2022-12-23: qty 40

## 2022-12-23 MED ORDER — OXYCODONE HCL 5 MG PO TABS
ORAL_TABLET | ORAL | Status: AC
  Filled 2022-12-23: qty 1

## 2022-12-23 MED ORDER — INDOCYANINE GREEN 25 MG IV SOLR
1.2500 mg | Freq: Once | INTRAVENOUS | Status: AC
  Administered 2022-12-23: 1.25 mg via INTRAVENOUS

## 2022-12-23 MED ORDER — DEXAMETHASONE SODIUM PHOSPHATE 10 MG/ML IJ SOLN
INTRAMUSCULAR | Status: DC | PRN
  Administered 2022-12-23: 10 mg via INTRAVENOUS

## 2022-12-23 MED ORDER — CEFAZOLIN SODIUM-DEXTROSE 2-4 GM/100ML-% IV SOLN
2.0000 g | INTRAVENOUS | Status: AC
  Administered 2022-12-23: 2 g via INTRAVENOUS

## 2022-12-23 MED ORDER — FENTANYL CITRATE (PF) 100 MCG/2ML IJ SOLN
INTRAMUSCULAR | Status: DC | PRN
  Administered 2022-12-23: 150 ug via INTRAVENOUS
  Administered 2022-12-23 (×2): 50 ug via INTRAVENOUS

## 2022-12-23 MED ORDER — MIDAZOLAM HCL 2 MG/2ML IJ SOLN
INTRAMUSCULAR | Status: AC
  Filled 2022-12-23: qty 2

## 2022-12-23 MED ORDER — LIDOCAINE-EPINEPHRINE (PF) 1 %-1:200000 IJ SOLN
INTRAMUSCULAR | Status: AC
  Filled 2022-12-23: qty 30

## 2022-12-23 MED ORDER — DEXMEDETOMIDINE HCL IN NACL 200 MCG/50ML IV SOLN
INTRAVENOUS | Status: DC | PRN
Start: 2022-12-23 — End: 2022-12-23
  Administered 2022-12-23: 8 ug via INTRAVENOUS
  Administered 2022-12-23: 12 ug via INTRAVENOUS

## 2022-12-23 MED ORDER — CHLORHEXIDINE GLUCONATE 0.12 % MT SOLN
15.0000 mL | Freq: Once | OROMUCOSAL | Status: AC
  Administered 2022-12-23: 15 mL via OROMUCOSAL

## 2022-12-23 MED ORDER — HYDROCODONE-ACETAMINOPHEN 5-325 MG PO TABS: 1.0000 | ORAL_TABLET | Freq: Four times a day (QID) | ORAL | 0 refills | Status: DC | PRN

## 2022-12-23 MED ORDER — ROCURONIUM BROMIDE 100 MG/10ML IV SOLN
INTRAVENOUS | Status: DC | PRN
  Administered 2022-12-23: 50 mg via INTRAVENOUS

## 2022-12-23 MED ORDER — CEFAZOLIN SODIUM-DEXTROSE 2-4 GM/100ML-% IV SOLN
INTRAVENOUS | Status: AC
  Filled 2022-12-23: qty 100

## 2022-12-23 MED ORDER — CHLORHEXIDINE GLUCONATE 0.12 % MT SOLN
OROMUCOSAL | Status: AC
  Filled 2022-12-23: qty 15

## 2022-12-23 MED ORDER — LACTATED RINGERS IV SOLN: INTRAVENOUS | Status: DC

## 2022-12-23 MED ORDER — INDOCYANINE GREEN 25 MG IV SOLR
INTRAVENOUS | Status: AC
  Filled 2022-12-23: qty 10

## 2022-12-23 SURGICAL SUPPLY — 51 items
ADH SKN CLS APL DERMABOND .7 (GAUZE/BANDAGES/DRESSINGS) ×2
ANCHOR TIS RET SYS 235ML (MISCELLANEOUS) ×2 IMPLANT
BAG PRESSURE INF REUSE 1000 (BAG) IMPLANT
BAG TISS RTRVL C235 10X14 (MISCELLANEOUS) ×2
BLADE SURG SZ11 CARB STEEL (BLADE) ×2 IMPLANT
CATH REDDICK CHOLANGI 4FR 50CM (CATHETERS) IMPLANT
CAUTERY HOOK MNPLR 1.6 DVNC XI (INSTRUMENTS) ×2 IMPLANT
CLIP LIGATING HEMO O LOK GREEN (MISCELLANEOUS) ×2 IMPLANT
DERMABOND ADVANCED .7 DNX12 (GAUZE/BANDAGES/DRESSINGS) ×2 IMPLANT
DRAPE ARM DVNC X/XI (DISPOSABLE) ×8 IMPLANT
DRAPE C-ARM XRAY 36X54 (DRAPES) IMPLANT
DRAPE COLUMN DVNC XI (DISPOSABLE) ×2 IMPLANT
ELECT CAUTERY BLADE 6.4 (BLADE) ×2 IMPLANT
ELECT REM PT RETURN 9FT ADLT (ELECTROSURGICAL) ×2
ELECTRODE REM PT RTRN 9FT ADLT (ELECTROSURGICAL) ×2 IMPLANT
FORCEPS BPLR FENES DVNC XI (FORCEP) ×2 IMPLANT
FORCEPS PROGRASP DVNC XI (FORCEP) ×2 IMPLANT
GLOVE BIOGEL PI IND STRL 7.0 (GLOVE) ×4 IMPLANT
GLOVE SURG SYN 6.5 ES PF (GLOVE) ×8 IMPLANT
GLOVE SURG SYN 6.5 PF PI (GLOVE) ×4 IMPLANT
GOWN STRL REUS W/ TWL LRG LVL3 (GOWN DISPOSABLE) ×6 IMPLANT
GOWN STRL REUS W/TWL LRG LVL3 (GOWN DISPOSABLE) ×8
GRASPER SUT TROCAR 14GX15 (MISCELLANEOUS) IMPLANT
IRRIGATOR SUCT 8 DISP DVNC XI (IRRIGATION / IRRIGATOR) IMPLANT
IV NS 1000ML (IV SOLUTION)
IV NS 1000ML BAXH (IV SOLUTION) IMPLANT
KIT TURNOVER KIT A (KITS) ×2 IMPLANT
LABEL OR SOLS (LABEL) ×2 IMPLANT
MANIFOLD NEPTUNE II (INSTRUMENTS) ×2 IMPLANT
NDL HYPO 22X1.5 SAFETY MO (MISCELLANEOUS) ×2 IMPLANT
NDL INSUFFLATION 14GA 120MM (NEEDLE) ×2 IMPLANT
NEEDLE HYPO 22X1.5 SAFETY MO (MISCELLANEOUS) ×2 IMPLANT
NEEDLE INSUFFLATION 14GA 120MM (NEEDLE) ×2 IMPLANT
NS IRRIG 500ML POUR BTL (IV SOLUTION) ×2 IMPLANT
OBTURATOR OPTICAL STND 8 DVNC (TROCAR) ×2
OBTURATOR OPTICALSTD 8 DVNC (TROCAR) ×2 IMPLANT
PACK LAP CHOLECYSTECTOMY (MISCELLANEOUS) ×2 IMPLANT
PENCIL SMOKE EVACUATOR (MISCELLANEOUS) ×2 IMPLANT
SEAL UNIV 5-12 XI (MISCELLANEOUS) ×8 IMPLANT
SET TUBE SMOKE EVAC HIGH FLOW (TUBING) ×2 IMPLANT
SOL ELECTROSURG ANTI STICK (MISCELLANEOUS) ×2
SOLUTION ELECTROSURG ANTI STCK (MISCELLANEOUS) ×2 IMPLANT
SPIKE FLUID TRANSFER (MISCELLANEOUS) ×4 IMPLANT
SUT MNCRL 4-0 (SUTURE) ×4
SUT MNCRL 4-0 27XMFL (SUTURE) ×4
SUT VICRYL 0 UR6 27IN ABS (SUTURE) ×2 IMPLANT
SUTURE MNCRL 4-0 27XMF (SUTURE) ×4 IMPLANT
SYR 30ML LL (SYRINGE) IMPLANT
SYSTEM WECK SHIELD CLOSURE (TROCAR) IMPLANT
TRAP FLUID SMOKE EVACUATOR (MISCELLANEOUS) ×2 IMPLANT
WATER STERILE IRR 500ML POUR (IV SOLUTION) ×2 IMPLANT

## 2022-12-23 NOTE — Anesthesia Postprocedure Evaluation (Signed)
Anesthesia Post Note  Patient: Jonathon Lozano  Procedure(s) Performed: XI ROBOTIC ASSISTED LAPAROSCOPIC CHOLECYSTECTOMY (Abdomen) INDOCYANINE GREEN FLUORESCENCE IMAGING (ICG)  Patient location during evaluation: PACU Anesthesia Type: General Level of consciousness: awake and alert Pain management: pain level controlled Vital Signs Assessment: post-procedure vital signs reviewed and stable Respiratory status: spontaneous breathing, nonlabored ventilation, respiratory function stable and patient connected to nasal cannula oxygen Cardiovascular status: blood pressure returned to baseline and stable Postop Assessment: no apparent nausea or vomiting Anesthetic complications: no  There were no known notable events for this encounter.   Last Vitals:  Vitals:   12/23/22 1352 12/23/22 1400  BP: 103/77 105/68  Pulse: (!) 57 72  Resp: 11 13  Temp: 36.7 C   SpO2: 100% 97%    Last Pain:  Vitals:   12/23/22 1352  TempSrc:   PainSc: Asleep                 Stephanie Coup

## 2022-12-23 NOTE — Interval H&P Note (Signed)
History and Physical Interval Note:  12/23/2022 11:58 AM  Jonathon Lozano  has presented today for surgery, with the diagnosis of biliary colic K80.50.  The various methods of treatment have been discussed with the patient and family. After consideration of risks, benefits and other options for treatment, the patient has consented to  Procedure(s): XI ROBOTIC ASSISTED LAPAROSCOPIC CHOLECYSTECTOMY (N/A) INDOCYANINE GREEN FLUORESCENCE IMAGING (ICG) (N/A) as a surgical intervention.  The patient's history has been reviewed, patient examined, no change in status, stable for surgery.  I have reviewed the patient's chart and labs.  Questions were answered to the patient's satisfaction.     Belvin Gauss Tonna Boehringer

## 2022-12-23 NOTE — Anesthesia Procedure Notes (Signed)
Procedure Name: Intubation Date/Time: 12/23/2022 12:58 PM  Performed by: Maryla Morrow., CRNAPre-anesthesia Checklist: Patient identified, Patient being monitored, Timeout performed, Emergency Drugs available and Suction available Patient Re-evaluated:Patient Re-evaluated prior to induction Oxygen Delivery Method: Circle system utilized Preoxygenation: Pre-oxygenation with 100% oxygen Induction Type: IV induction Ventilation: Mask ventilation without difficulty Laryngoscope Size: McGraph and 4 Grade View: Grade I Tube type: Oral Tube size: 7.5 mm Number of attempts: 1 Airway Equipment and Method: Stylet Placement Confirmation: ETT inserted through vocal cords under direct vision, positive ETCO2 and breath sounds checked- equal and bilateral Secured at: 22 cm Tube secured with: Tape Dental Injury: Teeth and Oropharynx as per pre-operative assessment

## 2022-12-23 NOTE — Transfer of Care (Signed)
Immediate Anesthesia Transfer of Care Note  Patient: Jonathon Lozano  Procedure(s) Performed: XI ROBOTIC ASSISTED LAPAROSCOPIC CHOLECYSTECTOMY (Abdomen) INDOCYANINE GREEN FLUORESCENCE IMAGING (ICG)  Patient Location: PACU  Anesthesia Type:General  Level of Consciousness: awake and patient cooperative  Airway & Oxygen Therapy: Patient Spontanous Breathing and Patient connected to face mask oxygen  Post-op Assessment: Report given to RN and Post -op Vital signs reviewed and stable  Post vital signs: stable  Last Vitals:  Vitals Value Taken Time  BP 103/77 12/23/22 1352  Temp    Pulse 71 12/23/22 1355  Resp 16 12/23/22 1355  SpO2 100 % 12/23/22 1355  Vitals shown include unfiled device data.  Last Pain:  Vitals:   12/23/22 1112  TempSrc: Oral  PainSc: 0-No pain      Patients Stated Pain Goal: 0 (12/23/22 1112)  Complications: No notable events documented.

## 2022-12-23 NOTE — Op Note (Addendum)
Preoperative diagnosis:  biliary colic  Postoperative diagnosis: same as above  Procedure: Robotic assisted Laparoscopic Cholecystectomy.   Anesthesia: GETA   Surgeon: Sung Amabile  Specimen: Gallbladder  Complications: None  EBL: 15mL  Wound Classification: Clean Contaminated  Indications: see HPI  Findings: Critical view of safety noted Cystic duct and artery identified, ligated and divided, clips remained intact at end of procedure Adequate hemostasis  Description of procedure:  The patient was placed on the operating table in the supine position. SCDs placed, pre-op abx administered.  General anesthesia was induced and OG tube placed by anesthesia. A time-out was completed verifying correct patient, procedure, site, positioning, and implant(s) and/or special equipment prior to beginning this procedure. The abdomen was prepped and draped in the usual sterile fashion.    Veress needle was placed at the Palmer's point and insufflation was started after confirming a positive saline drop test and no immediate increase in abdominal pressure.  After reaching 15 mm, the Veress needle was removed and a 8 mm port was placed via optiview technique above umbilicus measured 20mm from gallbladder.  The abdomen was inspected and no abnormalities or injuries were found.  Under direct vision, ports were placed in the following locations: One 12 mm patient left of the umbilicus, 8cm from the optiviewed port, one 8 mm port placed to the patient right of the umbilical port 8 cm apart.  1 additional 8 mm port placed lateral to the 12mm port.  Once ports were placed, The table was placed in the reverse Trendelenburg position with the right side up. The Xi platform was brought into the operative field and docked to the ports successfully.  An endoscope was placed through the umbilical port, fenestrated grasper through the adjacent patient right port, prograsp to the far patient left port, and then a hook  cautery in the left port.  The dome of the gallbladder was grasped with prograsp, passed and retracted over the dome of the liver. Adhesions between the gallbladder and omentum, duodenum and transverse colon were lysed via hook cautery. The infundibulum was grasped with the fenestrated grasper and retracted toward the right lower quadrant. This maneuver exposed Calot's triangle. The peritoneum overlying the gallbladder infundibulum was then dissected  and the cystic duct and cystic artery identified.  Critical view of safety with the liver bed clearly visible behind the duct and artery with no additional structures noted.  The cystic duct and cystic artery clipped and divided close to the gallbladder.     The gallbladder was then dissected from its peritoneal and liver bed attachments by electrocautery. Hemostasis was checked prior to removing the hook cautery and the Endo Catch bag was then placed through the 12 mm port and the gallbladder was removed.  The gallbladder was passed off the table as a specimen. There was no evidence of bleeding from the gallbladder fossa or cystic artery or leakage of the bile from the cystic duct stump. The 12 mm port site closed with PMI using 0 vicryl under direct vision.  Abdomen desufflated and secondary trocars were removed under direct vision. No bleeding was noted. All skin incisions then closed with subcuticular sutures of 4-0 monocryl and dressed with topical skin adhesive. The orogastric tube was removed and patient extubated.  The patient tolerated the procedure well and was taken to the postanesthesia care unit in stable condition.  All sponge and instrument count correct at end of procedure.

## 2022-12-23 NOTE — Discharge Instructions (Addendum)
Laparoscopic Cholecystectomy, Care After This sheet gives you information about how to care for yourself after your procedure. Your doctor may also give you more specific instructions. If you have problems or questions, contact your doctor. Follow these instructions at home: Care for cuts from surgery (incisions)  Follow instructions from your doctor about how to take care of your cuts from surgery. Make sure you: Wash your hands with soap and water before you change your bandage (dressing). If you cannot use soap and water, use hand sanitizer. Change your bandage as told by your doctor. Leave stitches (sutures), skin glue, or skin tape (adhesive) strips in place. They may need to stay in place for 2 weeks or longer. If tape strips get loose and curl up, you may trim the loose edges. Do not remove tape strips completely unless your doctor says it is okay. Do not take baths, swim, or use a hot tub until your doctor says it is okay. OK TO SHOWER 24HRS AFTER YOUR SURGERY.  Check your surgical cut area every day for signs of infection. Check for: More redness, swelling, or pain. More fluid or blood. Warmth. Pus or a bad smell. Activity Do not drive or use heavy machinery while taking prescription pain medicine. Do not play contact sports until your doctor says it is okay. Do not drive for 24 hours if you were given a medicine to help you relax (sedative). Rest as needed. Do not return to work or school until your doctor says it is okay. General instructions  tylenol and advil as needed for discomfort.  Please alternate between the two every four hours as needed for pain.    Use narcotics, if prescribed, only when tylenol and motrin is not enough to control pain.  325-650mg every 8hrs to max of 3000mg/24hrs (including the 325mg in every norco dose) for the tylenol.    Advil up to 800mg per dose every 8hrs as needed for pain.   To prevent or treat constipation while you are taking prescription  pain medicine, your doctor may recommend that you: Drink enough fluid to keep your pee (urine) clear or pale yellow. Take over-the-counter or prescription medicines. Eat foods that are high in fiber, such as fresh fruits and vegetables, whole grains, and beans. Limit foods that are high in fat and processed sugars, such as fried and sweet foods. Contact a doctor if: You develop a rash. You have more redness, swelling, or pain around your surgical cuts. You have more fluid or blood coming from your surgical cuts. Your surgical cuts feel warm to the touch. You have pus or a bad smell coming from your surgical cuts. You have a fever. One or more of your surgical cuts breaks open. You have trouble breathing. You have chest pain. You have pain that is getting worse in your shoulders. You faint or feel dizzy when you stand. You have very bad pain in your belly (abdomen). You are sick to your stomach (nauseous) for more than one day. You have throwing up (vomiting) that lasts for more than one day. You have leg pain. This information is not intended to replace advice given to you by your health care provider. Make sure you discuss any questions you have with your health care provider. Document Released: 01/12/2008 Document Revised: 10/24/2015 Document Reviewed: 09/21/2015 Elsevier Interactive Patient Education  2019 [employer]   AMBULATORY SURGERY  DISCHARGE INSTRUCTIONS   The drugs that you were given will stay in your system until tomorrow so   for the next 24 hours you should not:  Drive an automobile Make any legal decisions Drink any alcoholic beverage   You may resume regular meals tomorrow.  Today it is better to start with liquids and gradually work up to solid foods.  You may eat anything you prefer, but it is better to start with liquids, then soup and crackers, and gradually work up to solid foods.   Please notify your doctor immediately if you have any unusual  bleeding, trouble breathing, redness and pain at the surgery site, drainage, fever, or pain not relieved by medication.    Additional Instructions: 

## 2022-12-23 NOTE — Anesthesia Preprocedure Evaluation (Signed)
Anesthesia Evaluation  Patient identified by MRN, date of birth, ID band Patient awake    Reviewed: Allergy & Precautions, NPO status , Patient's Chart, lab work & pertinent test results  Airway Mallampati: II  TM Distance: >3 FB Neck ROM: full    Dental  (+) Chipped, Dental Advidsory Given   Pulmonary neg pulmonary ROS, former smoker   Pulmonary exam normal        Cardiovascular hypertension, negative cardio ROS Normal cardiovascular exam     Neuro/Psych negative neurological ROS  negative psych ROS   GI/Hepatic Neg liver ROS,GERD  Medicated,,  Endo/Other  negative endocrine ROS    Renal/GU      Musculoskeletal   Abdominal   Peds  Hematology negative hematology ROS (+)   Anesthesia Other Findings Past Medical History: No date: Anxiety 09/29/2020: Arthralgia of right temporomandibular joint No date: Asthma     Comment:  as a child No date: Attention deficit hyperactivity disorder (ADHD) 09/29/2020: Complications affecting other specified body systems,  hypertension No date: Depression No date: GERD (gastroesophageal reflux disease) No date: HLD (hyperlipidemia) No date: Hypertension No date: Tremor of both hands  History reviewed. No pertinent surgical history.  BMI    Body Mass Index: 38.55 kg/m      Reproductive/Obstetrics negative OB ROS                             Anesthesia Physical Anesthesia Plan  ASA: 2  Anesthesia Plan: General ETT and General   Post-op Pain Management:    Induction: Intravenous  PONV Risk Score and Plan: Ondansetron, Dexamethasone and Midazolam  Airway Management Planned: Oral ETT  Additional Equipment:   Intra-op Plan:   Post-operative Plan: Extubation in OR  Informed Consent: I have reviewed the patients History and Physical, chart, labs and discussed the procedure including the risks, benefits and alternatives for the proposed  anesthesia with the patient or authorized representative who has indicated his/her understanding and acceptance.     Dental Advisory Given  Plan Discussed with: Anesthesiologist, CRNA and Surgeon  Anesthesia Plan Comments: (Patient consented for risks of anesthesia including but not limited to:  - adverse reactions to medications - damage to eyes, teeth, lips or other oral mucosa - nerve damage due to positioning  - sore throat or hoarseness - Damage to heart, brain, nerves, lungs, other parts of body or loss of life  Patient voiced understanding.)       Anesthesia Quick Evaluation

## 2022-12-26 NOTE — Group Note (Deleted)

## 2023-01-05 ENCOUNTER — Ambulatory Visit: Payer: 59 | Admitting: Family Medicine

## 2023-01-18 ENCOUNTER — Ambulatory Visit (INDEPENDENT_AMBULATORY_CARE_PROVIDER_SITE_OTHER): Payer: 59 | Admitting: Family Medicine

## 2023-01-18 ENCOUNTER — Encounter: Payer: Self-pay | Admitting: Family Medicine

## 2023-01-18 VITALS — BP 138/88 | HR 78 | Ht 69.0 in | Wt 261.0 lb

## 2023-01-18 DIAGNOSIS — Z23 Encounter for immunization: Secondary | ICD-10-CM | POA: Diagnosis not present

## 2023-01-18 DIAGNOSIS — E785 Hyperlipidemia, unspecified: Secondary | ICD-10-CM | POA: Diagnosis not present

## 2023-01-18 DIAGNOSIS — G4733 Obstructive sleep apnea (adult) (pediatric): Secondary | ICD-10-CM | POA: Insufficient documentation

## 2023-01-18 NOTE — Progress Notes (Signed)
     Primary Care / Sports Medicine Office Visit  Patient Information:  Patient ID: Jonathon Lozano, male DOB: [birth year] Age: 42 y.o. MRN: [id number]   Jonathon Lozano is a pleasant 42 y.o. male presenting with the following:  Chief Complaint  Patient presents with   Hypertension    Vitals:   01/18/23 1037  BP: 138/88  Pulse: 78  SpO2: 98%   Vitals:   01/18/23 1037  Weight: 261 lb (118.4 kg)  Height: 5\' 9"  (1.753 m)   Body mass index is 38.54 kg/m.  No results found.   Independent interpretation of notes and tests performed by another provider:   None  Procedures performed:   None  Pertinent History, Exam, Impression, and Recommendations:   Problem List Items Addressed This Visit       Respiratory   OSA on CPAP - Primary    Nightly compliance with CPAP, noting that he is waking up without any problems, doing overall well. Some recent nasal pillow fit issues for which we discussed checking for additional sizes and/or contacting supply group for resolution.        Other   Need for immunization against influenza   Relevant Orders   Flu vaccine trivalent PF, 6mos and older(Flulaval,Afluria,Fluarix,Fluzone) (Completed)   Hyperlipidemia    Recent cholecystectomy, has returned to baseline tolerance of food, and is essentially asymptomatic. Has been cleared from GI perspective.      I provided a total time of 20 minutes including both face-to-face and non-face-to-face time on 01/18/2023 inclusive of time utilized for medical chart review, information gathering, care coordination with staff, and documentation completion.   Orders & Medications Medications: No orders of the defined types were placed in this encounter.  Orders Placed This Encounter  Procedures   Flu vaccine trivalent PF, 6mos and older(Flulaval,Afluria,Fluarix,Fluzone)     No follow-ups on file.     Jerrol Banana, MD, [facility]   Primary Care Sports Medicine Primary Care and  Sports Medicine at [facility]

## 2023-01-18 NOTE — Assessment & Plan Note (Signed)
Recent cholecystectomy, has returned to baseline tolerance of food, and is essentially asymptomatic. Has been cleared from GI perspective.

## 2023-01-18 NOTE — Assessment & Plan Note (Signed)
Nightly compliance with CPAP, noting that he is waking up without any problems, doing overall well. Some recent nasal pillow fit issues for which we discussed checking for additional sizes and/or contacting supply group for resolution.

## 2023-01-30 ENCOUNTER — Other Ambulatory Visit: Payer: Self-pay | Admitting: Family Medicine

## 2023-01-30 DIAGNOSIS — I10 Essential (primary) hypertension: Secondary | ICD-10-CM

## 2023-01-30 NOTE — Telephone Encounter (Signed)
Requested Prescriptions  Pending Prescriptions Disp Refills   amLODipine (NORVASC) 10 MG tablet [Pharmacy Med Name: AMLODIPINE BESYLATE 10 MG TAB] 90 tablet 1    Sig: TAKE 1 TABLET BY MOUTH EVERY DAY     Cardiovascular: Calcium Channel Blockers 2 Passed - 01/30/2023  1:44 AM      Passed - Last BP in normal range    BP Readings from Last 1 Encounters:  01/18/23 138/88         Passed - Last Heart Rate in normal range    Pulse Readings from Last 1 Encounters:  01/18/23 78         Passed - Valid encounter within last 6 months    Recent Outpatient Visits           1 week ago OSA on CPAP   [place] [facility], Ocie Bob, MD   7 months ago Primary hypertension   [place] [facility], Ocie Bob, MD   1 year ago Annual physical exam   [facility] at [facility], Ocie Bob, MD   1 year ago Primary hypertension   [place] [facility], Ocie Bob, MD   2 years ago Primary hypertension   [place] [facility], Ocie Bob, MD       Future Appointments             In 1 month Ashley Royalty, Ocie Bob, MD [facility] at [facility]             metoprolol succinate (TOPROL-XL) 25 MG 24 hr tablet [Pharmacy Med Name: METOPROLOL SUCC ER 25 MG TAB] 90 tablet 1    Sig: TAKE 1 TABLET BY MOUTH DAILY     Cardiovascular:  Beta Blockers Passed - 01/30/2023  1:44 AM      Passed - Last BP in normal range    BP Readings from Last 1 Encounters:  01/18/23 138/88         Passed - Last Heart Rate in normal range    Pulse Readings from Last 1 Encounters:  01/18/23 78         Passed - Valid encounter within last 6 months    Recent Outpatient Visits           1 week ago OSA on CPAP   [place] [facility], Ocie Bob, MD   7 months ago Primary hypertension   [place] [facility], Ocie Bob, MD   1 year ago Annual physical exam   [facility] at [facility], Ocie Bob, MD   1 year ago Primary hypertension   [place] [facility], Ocie Bob, MD   2 years ago Primary hypertension   [facility] at [facility], Ocie Bob, MD       Future Appointments             In 1 month Ashley Royalty, Ocie Bob, MD [facility] Care & Sports Medicine at [facility]

## 2023-03-07 ENCOUNTER — Encounter: Payer: Self-pay | Admitting: Family Medicine

## 2023-03-08 ENCOUNTER — Encounter: Payer: Self-pay | Admitting: Family Medicine

## 2023-03-08 ENCOUNTER — Ambulatory Visit (INDEPENDENT_AMBULATORY_CARE_PROVIDER_SITE_OTHER): Payer: 59 | Admitting: Family Medicine

## 2023-03-08 VITALS — BP 144/90 | HR 73 | Ht 69.0 in | Wt 269.2 lb

## 2023-03-08 DIAGNOSIS — F419 Anxiety disorder, unspecified: Secondary | ICD-10-CM | POA: Diagnosis not present

## 2023-03-08 DIAGNOSIS — I10 Essential (primary) hypertension: Secondary | ICD-10-CM | POA: Diagnosis not present

## 2023-03-08 DIAGNOSIS — M26609 Unspecified temporomandibular joint disorder, unspecified side: Secondary | ICD-10-CM | POA: Diagnosis not present

## 2023-03-08 DIAGNOSIS — Z23 Encounter for immunization: Secondary | ICD-10-CM

## 2023-03-08 DIAGNOSIS — Z800 Family history of malignant neoplasm of digestive organs: Secondary | ICD-10-CM | POA: Diagnosis not present

## 2023-03-08 DIAGNOSIS — G4733 Obstructive sleep apnea (adult) (pediatric): Secondary | ICD-10-CM

## 2023-03-08 DIAGNOSIS — K2100 Gastro-esophageal reflux disease with esophagitis, without bleeding: Secondary | ICD-10-CM

## 2023-03-08 DIAGNOSIS — E559 Vitamin D deficiency, unspecified: Secondary | ICD-10-CM

## 2023-03-08 DIAGNOSIS — Z0000 Encounter for general adult medical examination without abnormal findings: Secondary | ICD-10-CM | POA: Diagnosis not present

## 2023-03-08 DIAGNOSIS — F5104 Psychophysiologic insomnia: Secondary | ICD-10-CM | POA: Diagnosis not present

## 2023-03-08 DIAGNOSIS — F5232 Male orgasmic disorder: Secondary | ICD-10-CM | POA: Diagnosis not present

## 2023-03-08 DIAGNOSIS — F32A Depression, unspecified: Secondary | ICD-10-CM

## 2023-03-08 DIAGNOSIS — E785 Hyperlipidemia, unspecified: Secondary | ICD-10-CM

## 2023-03-08 MED ORDER — METHOCARBAMOL 500 MG PO TABS
500.0000 mg | ORAL_TABLET | Freq: Three times a day (TID) | ORAL | 0 refills | Status: DC | PRN
Start: 2023-03-08 — End: 2023-07-25

## 2023-03-08 MED ORDER — BUPROPION HCL ER (XL) 150 MG PO TB24
150.0000 mg | ORAL_TABLET | ORAL | 1 refills | Status: DC
Start: 2023-03-08 — End: 2023-05-03

## 2023-03-08 MED ORDER — TRAZODONE HCL 50 MG PO TABS
25.0000 mg | ORAL_TABLET | Freq: Every evening | ORAL | 0 refills | Status: DC | PRN
Start: 2023-03-08 — End: 2023-05-03

## 2023-03-08 NOTE — Assessment & Plan Note (Signed)
On Trintellix, reports of improved mood but persistent anorgasmia. -Continue Trintellix. -Addition of Wellbutrin to regimen.

## 2023-03-08 NOTE — Assessment & Plan Note (Signed)
The patient also reports some issues with TMJ and ear discomfort, which have been persistent despite relaxation techniques.  Temporomandibular Joint (TMJ) Syndrome Reports of ear discomfort and jaw tension. Previous relief with relaxation techniques, now less effective. -Prescribe muscle relaxer for as needed use. -Consider ENT referral for further management if symptoms persist or worsen.

## 2023-03-08 NOTE — Assessment & Plan Note (Addendum)
Patient reports increased anxiety and some issues with sleep.  Depression/Anxiety On Trintellix, reports of improved mood but increased background anxiety. -Continue Trintellix. -Start addition of Wellbutrin to regimen. -Consider future addition of Auvelity or Vraylar if symptoms persist despite improved sleep.

## 2023-03-08 NOTE — Patient Instructions (Addendum)
-   Obtain fasting labs with orders provided (can have water or black coffee but otherwise no food or drink x 8 hours before labs) - Review information provided - Attend eye doctor annually, dentist every 6 months, work towards or maintain 30 minutes of moderate intensity physical activity at least 5 days per week, and consume a balanced diet - Contact us for any questions between now and then  Additionally:  - Hypertension: Continue Amlodipine and Metoprolol. Check blood pressure at upcoming appointment.  - GERD: Continue Pepcid as needed.  - TMJ Syndrome: Prescribe muscle relaxer for as-needed use. Consider ENT referral if symptoms persist or worsen.  - Insomnia: Prescribe Trazodone for as-needed use to improve sleep. Continue CPAP.  - Depression/Anxiety: Continue Trintellix. Start Wellbutrin. If symptoms persist despite improved sleep, consider Auvelity or Vraylar in the future.  - Family History of Colon Cancer: Refer to GI specialist for consultation and potential early colonoscopy.  - Follow up in 6 months.

## 2023-03-08 NOTE — Assessment & Plan Note (Signed)
Gastroesophageal Reflux Disease (GERD) Improvement in symptoms since gallbladder surgery. Infrequent use of Pepcid. -Continue as needed use of Pepcid.

## 2023-03-08 NOTE — Progress Notes (Signed)
Annual Physical Exam Visit  Patient Information:  Patient ID: Jonathon Lozano, male DOB: October 04, 1980 Age: 42 y.o. MRN: [id number]   Subjective:   CC: Annual Physical Exam  HPI:  Jonathon Lozano is here for their annual physical.  I reviewed the past medical history, family history, social history, surgical history, and allergies today and changes were made as necessary.  Please see the problem list section below for additional details.  Past Medical History: Past Medical History:  Diagnosis Date   Anxiety    Arthralgia of right temporomandibular joint 09/29/2020   Asthma    as a child   Attention deficit hyperactivity disorder (ADHD)    Complications affecting other specified body systems, hypertension 09/29/2020   Depression    GERD (gastroesophageal reflux disease)    HLD (hyperlipidemia)    Hypertension    Tremor of both hands    Past Surgical History: History reviewed. No pertinent surgical history. Family History: Family History  Problem Relation Age of Onset   Hypertension Mother    Hyperlipidemia Father    Hypertension Father    Mental retardation Brother    Dementia Maternal Grandmother    Stroke Paternal Grandmother    Pancreatic cancer Paternal Grandmother 20 - 67       early 3s   Stroke Paternal Grandfather    Colon cancer Maternal Uncle 50   Colon cancer Maternal Great-grandfather    Allergies: No Known Allergies Health Maintenance: Health Maintenance  Topic Date Due   DTaP/Tdap/Td (2 - Td or Tdap) 09/30/2030   INFLUENZA VACCINE  Completed   Hepatitis C Screening  Completed   HIV Screening  Completed   HPV VACCINES  Aged Out   COVID-19 Vaccine  Discontinued    HM Colonoscopy     This patient has no relevant Health Maintenance data.      Medications: Current Outpatient Medications on File Prior to Visit  Medication Sig Dispense Refill   amLODipine (NORVASC) 10 MG tablet TAKE 1 TABLET BY MOUTH EVERY DAY 90 tablet 1    aspirin-acetaminophen-caffeine (EXCEDRIN MIGRAINE) 250-250-65 MG tablet Take by mouth every 6 (six) hours as needed for headache.     cetirizine (ZYRTEC) 10 MG tablet Take 10 mg by mouth daily.     doxylamine, Sleep, (UNISOM) 25 MG tablet Take 25 mg by mouth at bedtime as needed.     famotidine-calcium carbonate-magnesium hydroxide (PEPCID COMPLETE) 10-800-165 MG chewable tablet Chew 1 tablet by mouth daily as needed.     melatonin 5 MG TABS Take 5 mg by mouth at bedtime.     metoprolol succinate (TOPROL-XL) 25 MG 24 hr tablet TAKE 1 TABLET BY MOUTH DAILY 90 tablet 1   ondansetron (ZOFRAN-ODT) 4 MG disintegrating tablet Take 1 tablet (4 mg total) by mouth every 8 (eight) hours as needed. 20 tablet 0   vortioxetine HBr (TRINTELLIX) 20 MG TABS tablet Take 1 tablet (20 mg total) by mouth daily. 90 tablet 3   No current facility-administered medications on file prior to visit.    Objective:   Vitals:   03/08/23 0904  BP: (!) 144/90  Pulse: 73  SpO2: 97%   Vitals:   03/08/23 0904  Weight: 269 lb 3.2 oz (122.1 kg)  Height: 5\' 9"  (1.753 m)   Body mass index is 39.75 kg/m.  General: Well Developed, well nourished, and in no acute distress.  Neuro: Alert and oriented x3, extra-ocular muscles intact, sensation grossly intact. Cranial nerves II through XII are  grossly intact, motor, sensory, and coordinative functions are intact. HEENT: Normocephalic, atraumatic, neck supple, no masses, no lymphadenopathy, thyroid nonenlarged. Oropharynx, nasopharynx with erythematous and mildly swollen turbinates bilaterally, tympanic membranes and external ear canals are unremarkable. Skin: Warm and dry, no rashes noted.  Cardiac: Regular rate and rhythm, no murmurs rubs or gallops. No peripheral edema. Pulses symmetric. Respiratory: Clear to auscultation bilaterally. Speaking in full sentences.  Abdominal: Soft, nontender, nondistended, positive bowel sounds, no masses, no organomegaly. Musculoskeletal:  Stable, and with full range of motion.   Impression and Recommendations:   The patient was counselled, risk factors were discussed, and anticipatory guidance given.  Problem List Items Addressed This Visit       Cardiovascular and Mediastinum   Primary hypertension    The patient's blood pressure is slightly elevated at 144/90, despite taking amlodipine and metoprolol.  Hypertension Elevated blood pressure readings at today's visit (144/90) despite adherence to Amlodipine and Metoprolol. No significant symptoms of cardiovascular compromise reported. -Continue current antihypertensive regimen. -Check blood pressure at follow-up.      Relevant Orders   Comprehensive metabolic panel   Lipid panel     Respiratory   OSA on CPAP     The patient reports continued use of the CPAP machine.        Digestive   Gastroesophageal reflux disease with esophagitis without hemorrhage    Gastroesophageal Reflux Disease (GERD) Improvement in symptoms since gallbladder surgery. Infrequent use of Pepcid. -Continue as needed use of Pepcid.        Musculoskeletal and Integument   TMJ dysfunction     The patient also reports some issues with TMJ and ear discomfort, which have been persistent despite relaxation techniques.  Temporomandibular Joint (TMJ) Syndrome Reports of ear discomfort and jaw tension. Previous relief with relaxation techniques, now less effective. -Prescribe muscle relaxer for as needed use. -Consider ENT referral for further management if symptoms persist or worsen.      Relevant Medications   methocarbamol (ROBAXIN) 500 MG tablet     Other   Psychophysiological insomnia    Patient reports increased anxiety and some issues with sleep.  Insomnia Reports of improved sleep with use of CPAP and Unisom. -Prescribe Trazodone for as needed use to aid sleep.      Relevant Medications   traZODone (DESYREL) 50 MG tablet   Other Relevant Orders   TSH   Hyperlipidemia    Relevant Orders   Comprehensive metabolic panel   Lipid panel   Healthcare maintenance    Annual examination completed, risk stratification labs ordered, anticipatory guidance provided.  We will follow labs once resulted.      Relevant Orders   CBC   Comprehensive metabolic panel   Hemoglobin A1c   Lipid panel   PSA Total (Reflex To Free)   TSH   VITAMIN D 25 Hydroxy (Vit-D Deficiency, Fractures)   Anxiety and depression - Primary    Patient reports increased anxiety and some issues with sleep.  Depression/Anxiety On Trintellix, reports of improved mood but increased background anxiety. -Continue Trintellix. -Start addition of Wellbutrin to regimen. -Consider future addition of Auvelity or Vraylar if symptoms persist despite improved sleep.      Relevant Medications   traZODone (DESYREL) 50 MG tablet   buPROPion (WELLBUTRIN XL) 150 MG 24 hr tablet   Anorgasmia of male    On Trintellix, reports of improved mood but persistent anorgasmia. -Continue Trintellix. -Addition of Wellbutrin to regimen.      Annual physical exam  Relevant Orders   CBC   Comprehensive metabolic panel   Hemoglobin A1c   Lipid panel   PSA Total (Reflex To Free)   TSH   VITAMIN D 25 Hydroxy (Vit-D Deficiency, Fractures)   Other Visit Diagnoses     Need for COVID-19 vaccine       Relevant Orders   Pfizer Comirnaty Covid -19 Vaccine 31yrs and older (Completed)   Family history of colon cancer       Relevant Orders   Ambulatory referral to Gastroenterology   Vitamin D deficiency       Relevant Orders   VITAMIN D 25 Hydroxy (Vit-D Deficiency, Fractures)        Orders & Medications Medications:  Meds ordered this encounter  Medications   methocarbamol (ROBAXIN) 500 MG tablet    Sig: Take 1 tablet (500 mg total) by mouth every 8 (eight) hours as needed for muscle spasms.    Dispense:  30 tablet    Refill:  0   traZODone (DESYREL) 50 MG tablet    Sig: Take 0.5-1 tablets (25-50 mg  total) by mouth at bedtime as needed for sleep.    Dispense:  30 tablet    Refill:  0   buPROPion (WELLBUTRIN XL) 150 MG 24 hr tablet    Sig: Take 1 tablet (150 mg total) by mouth every morning.    Dispense:  30 tablet    Refill:  1   Orders Placed This Encounter  Procedures   Pfizer Comirnaty Covid -19 Vaccine 62yrs and older   CBC   Comprehensive metabolic panel   Hemoglobin A1c   Lipid panel   PSA Total (Reflex To Free)   TSH   VITAMIN D 25 Hydroxy (Vit-D Deficiency, Fractures)   Ambulatory referral to Gastroenterology     Return in about 6 months (around 09/05/2023) for BP recheck.    Jerrol Banana, MD, [facility]   Primary Care Sports Medicine Primary Care and Sports Medicine at [facility]

## 2023-03-08 NOTE — Assessment & Plan Note (Signed)
Patient reports increased anxiety and some issues with sleep.  Insomnia Reports of improved sleep with use of CPAP and Unisom. -Prescribe Trazodone for as needed use to aid sleep.

## 2023-03-08 NOTE — Assessment & Plan Note (Signed)
The patient reports continued use of the CPAP machine.

## 2023-03-08 NOTE — Assessment & Plan Note (Signed)
Annual examination completed, risk stratification labs ordered, anticipatory guidance provided.  We will follow labs once resulted. 

## 2023-03-08 NOTE — Assessment & Plan Note (Signed)
The patient's blood pressure is slightly elevated at 144/90, despite taking amlodipine and metoprolol.  Hypertension Elevated blood pressure readings at today's visit (144/90) despite adherence to Amlodipine and Metoprolol. No significant symptoms of cardiovascular compromise reported. -Continue current antihypertensive regimen. -Check blood pressure at follow-up.

## 2023-03-09 ENCOUNTER — Encounter: Payer: Self-pay | Admitting: Family Medicine

## 2023-03-09 ENCOUNTER — Other Ambulatory Visit: Payer: Self-pay | Admitting: Family Medicine

## 2023-03-09 DIAGNOSIS — E559 Vitamin D deficiency, unspecified: Secondary | ICD-10-CM

## 2023-03-09 LAB — LIPID PANEL
Chol/HDL Ratio: 4.4 ratio (ref 0.0–5.0)
Cholesterol, Total: 199 mg/dL (ref 100–199)
HDL: 45 mg/dL (ref >39)
LDL Chol Calc (NIH): 133 mg/dL — ABNORMAL HIGH (ref 0–99)
Triglycerides: 116 mg/dL (ref 0–149)
VLDL Cholesterol Cal: 21 mg/dL (ref 5–40)

## 2023-03-09 LAB — HEMOGLOBIN A1C
Est. average glucose Bld gHb Est-mCnc: 131 mg/dL
Hgb A1c MFr Bld: 6.2 % — ABNORMAL HIGH (ref 4.8–5.6)

## 2023-03-09 LAB — VITAMIN D 25 HYDROXY (VIT D DEFICIENCY, FRACTURES): Vit D, 25-Hydroxy: 18.1 ng/mL — ABNORMAL LOW (ref 30.0–100.0)

## 2023-03-09 LAB — TSH: TSH: 2.93 u[IU]/mL (ref 0.450–4.500)

## 2023-03-09 LAB — COMPREHENSIVE METABOLIC PANEL
ALT: 23 [IU]/L (ref 0–44)
AST: 23 [IU]/L (ref 0–40)
Albumin: 4.2 g/dL (ref 4.1–5.1)
Alkaline Phosphatase: 103 [IU]/L (ref 44–121)
BUN/Creatinine Ratio: 8 — ABNORMAL LOW (ref 9–20)
BUN: 11 mg/dL (ref 6–24)
Bilirubin Total: 0.4 mg/dL (ref 0.0–1.2)
CO2: 22 mmol/L (ref 20–29)
Calcium: 9.2 mg/dL (ref 8.7–10.2)
Chloride: 105 mmol/L (ref 96–106)
Creatinine, Ser: 1.41 mg/dL — ABNORMAL HIGH (ref 0.76–1.27)
Globulin, Total: 2.7 g/dL (ref 1.5–4.5)
Glucose: 95 mg/dL (ref 70–99)
Potassium: 4.3 mmol/L (ref 3.5–5.2)
Sodium: 142 mmol/L (ref 134–144)
Total Protein: 6.9 g/dL (ref 6.0–8.5)
eGFR: 65 mL/min/{1.73_m2} (ref >59)

## 2023-03-09 LAB — CBC
Hematocrit: 46.2 % (ref 37.5–51.0)
Hemoglobin: 15.2 g/dL (ref 13.0–17.7)
MCH: 27.8 pg (ref 26.6–33.0)
MCHC: 32.9 g/dL (ref 31.5–35.7)
MCV: 85 fL (ref 79–97)
Platelets: 295 10*3/uL (ref 150–450)
RBC: 5.47 x10E6/uL (ref 4.14–5.80)
RDW: 13.2 % (ref 11.6–15.4)
WBC: 7.2 10*3/uL (ref 3.4–10.8)

## 2023-03-09 LAB — PSA TOTAL (REFLEX TO FREE): Prostate Specific Ag, Serum: 0.5 ng/mL (ref 0.0–4.0)

## 2023-03-09 MED ORDER — VITAMIN D (ERGOCALCIFEROL) 1.25 MG (50000 UNIT) PO CAPS
50000.0000 [IU] | ORAL_CAPSULE | ORAL | 0 refills | Status: DC
Start: 2023-03-09 — End: 2023-08-28

## 2023-03-23 ENCOUNTER — Telehealth: Payer: Self-pay

## 2023-03-23 NOTE — Telephone Encounter (Signed)
Left voice message for patient to call me back directly to discuss his colonoscopy referral.  Thanks,  Marcelino Duster, CMA

## 2023-03-29 ENCOUNTER — Encounter: Payer: Self-pay | Admitting: *Deleted

## 2023-03-30 ENCOUNTER — Telehealth: Payer: Self-pay

## 2023-03-30 ENCOUNTER — Other Ambulatory Visit: Payer: Self-pay | Admitting: Family Medicine

## 2023-03-30 DIAGNOSIS — F5104 Psychophysiologic insomnia: Secondary | ICD-10-CM

## 2023-03-30 DIAGNOSIS — F32A Depression, unspecified: Secondary | ICD-10-CM

## 2023-03-30 DIAGNOSIS — F419 Anxiety disorder, unspecified: Secondary | ICD-10-CM

## 2023-03-30 NOTE — Telephone Encounter (Signed)
Requested medications are due for refill today.  Trazodone yes, Wellbutrin no  Requested medications are on the active medications list.  yes  Last refill. Trazodone 03/08/2023 #30 0 rf, Wellbutrin 03/08/2023 #30 1 rf  Future visit scheduled.   yes  Notes to clinic.  Pt is requesting  a 90 day supply. Trazodone is not delegated.    Requested Prescriptions  Pending Prescriptions Disp Refills   traZODone (DESYREL) 50 MG tablet [Pharmacy Med Name: TRAZODONE 50 MG TABLET] 90 tablet 1    Sig: TAKE 0.5-1 TABLETS BY MOUTH AT BEDTIME AS NEEDED FOR SLEEP.     Psychiatry: Antidepressants - Serotonin Modulator Passed - 03/30/2023  1:53 PM      Passed - Completed PHQ-2 or PHQ-9 in the last 360 days      Passed - Valid encounter within last 6 months    Recent Outpatient Visits           3 weeks ago Anxiety and depression   [place] [facility], Ocie Bob, MD   2 months ago OSA on CPAP   [place] [facility] Medicine at [facility], Ocie Bob, MD   8 months ago Primary hypertension   [place] [facility] Medicine at [facility], Ocie Bob, MD   1 year ago Annual physical exam   [place] [facility] Medicine at [facility], Ocie Bob, MD   1 year ago Primary hypertension   [place] [facility] Medicine at [facility], Ocie Bob, MD       Future Appointments             In 4 months Ashley Royalty, Ocie Bob, MD [facility] Primary Care & Sports Medicine at [facility], [facility]             buPROPion (WELLBUTRIN XL) 150 MG 24 hr tablet [Pharmacy Med Name: BUPROPION HCL XL 150 MG TABLET] 90 tablet 1    Sig: TAKE 1 TABLET BY MOUTH EVERY DAY IN THE MORNING     Psychiatry: Antidepressants - bupropion Failed - 03/30/2023  1:53 PM      Failed - Cr in normal range and within 360 days    Creatinine, Ser  Date Value Ref Range Status  03/08/2023  1.41 (H) 0.76 - 1.27 mg/dL Final         Failed - Last BP in normal range    BP Readings from Last 1 Encounters:  03/08/23 (!) 144/90         Passed - AST in normal range and within 360 days    AST  Date Value Ref Range Status  03/08/2023 23 0 - 40 IU/L Final         Passed - ALT in normal range and within 360 days    ALT  Date Value Ref Range Status  03/08/2023 23 0 - 44 IU/L Final         Passed - Completed PHQ-2 or PHQ-9 in the last 360 days      Passed - Valid encounter within last 6 months    Recent Outpatient Visits           3 weeks ago Anxiety and depression   [place] [facility] at [facility], Ocie Bob, MD   2 months ago OSA on CPAP   [place] [facility] at  [facility], [facility]   8 months ago Primary hypertension   [place] [facility] at [facility], Ocie Bob, MD   1 year ago Annual physical exam   [facility] Primary Care & Sports Medicine at [facility], Ocie Bob, MD   1 year ago Primary hypertension   [facility] Primary Care & Sports Medicine at [facility], Ocie Bob, MD       Future Appointments             In 4 months Ashley Royalty, Ocie Bob, MD [facility] Primary Care & Sports Medicine at [facility], [facility]

## 2023-03-30 NOTE — Telephone Encounter (Signed)
Patient called in left a voicemail requesting to schedule his colonoscopy. I called the patient back to inform her we receive her message, and I sent the message to scheduler.

## 2023-03-30 NOTE — Telephone Encounter (Signed)
Returned phone call to patient to discuss colonoscopy referral.  Left voice message requesting call back.  Thanks,  [place], [place]

## 2023-03-31 NOTE — Telephone Encounter (Signed)
Call returned.  Discussed colonoscopy referral with patient-advised him to check with his insurance prior to scheduling his colonoscopy because insurance doesn't always cover the colonoscopy for pt under the age of 71 and even though there is a family history the family history is covered for 1st degree family member such as mother, father, sibling.  He plans to check his insurance and call me back.  Thanks,  [place], [place]

## 2023-05-02 ENCOUNTER — Other Ambulatory Visit: Payer: Self-pay | Admitting: Family Medicine

## 2023-05-02 DIAGNOSIS — F32A Depression, unspecified: Secondary | ICD-10-CM

## 2023-05-02 DIAGNOSIS — F5104 Psychophysiologic insomnia: Secondary | ICD-10-CM

## 2023-05-03 NOTE — Telephone Encounter (Signed)
 Requested Prescriptions  Pending Prescriptions Disp Refills   traZODone  (DESYREL ) 50 MG tablet [Pharmacy Med Name: TRAZODONE  50 MG TABLET] 30 tablet 2    Sig: TAKE 0.5-1 TABLETS BY MOUTH AT BEDTIME AS NEEDED FOR SLEEP.     Psychiatry: Antidepressants - Serotonin Modulator Passed - 05/03/2023  1:09 PM      Passed - Completed PHQ-2 or PHQ-9 in the last 360 days      Passed - Valid encounter within last 6 months    Recent Outpatient Visits           1 month ago Anxiety and depression   [place] [facility], Dessie Flow, MD   3 months ago OSA on CPAP   [place] [facility] Medicine at [facility], Dessie Flow, MD   10 months ago Primary hypertension   [place] [facility] at [facility], Dessie Flow, MD   1 year ago Annual physical exam   [place] [facility] at [facility], Dessie Flow, MD   1 year ago Primary hypertension   [place] [facility] Sports Medicine at [facility], Dessie Flow, MD       Future Appointments             In 3 months Augustus Ledger, Dessie Flow, MD [facility] Primary Care & Sports Medicine at [facility], [facility]             buPROPion  (WELLBUTRIN  XL) 150 MG 24 hr tablet [Pharmacy Med Name: BUPROPION  HCL XL 150 MG TABLET] 90 tablet 1    Sig: TAKE 1 TABLET BY MOUTH EVERY DAY IN THE MORNING     Psychiatry: Antidepressants - bupropion  Failed - 05/03/2023  1:09 PM      Failed - Cr in normal range and within 360 days    Creatinine, Ser  Date Value Ref Range Status  03/08/2023 1.41 (H) 0.76 - 1.27 mg/dL Final         Failed - Last BP in normal range    BP Readings from Last 1 Encounters:  03/08/23 (!) 144/90         Passed - AST in normal range and within 360 days    AST  Date Value Ref Range Status  03/08/2023 23 0 - 40 IU/L Final         Passed - ALT in normal range and within 360 days    ALT  Date  Value Ref Range Status  03/08/2023 23 0 - 44 IU/L Final         Passed - Completed PHQ-2 or PHQ-9 in the last 360 days      Passed - Valid encounter within last 6 months    Recent Outpatient Visits           1 month ago Anxiety and depression   [place] [facility] at [facility], Dessie Flow, MD   3 months ago OSA on CPAP   [place] [facility] at [facility], Dessie Flow, MD   10 months ago Primary hypertension   [place] [facility] at [facility], Dessie Flow, MD   1 year ago Annual physical exam   [facility] Primary Care & Sports Medicine at [facility], Dessie Flow, MD   1 year ago Primary hypertension   [facility] at [facility], Dessie Flow, MD       Future Appointments             In 3 months Augustus Ledger, Dessie Flow, MD [facility] Primary Care & Sports Medicine at [facility], [facility]

## 2023-05-08 ENCOUNTER — Other Ambulatory Visit: Payer: Self-pay | Admitting: Family Medicine

## 2023-05-08 DIAGNOSIS — F32A Depression, unspecified: Secondary | ICD-10-CM

## 2023-05-08 NOTE — Telephone Encounter (Signed)
Requested Prescriptions  Pending Prescriptions Disp Refills   TRINTELLIX 20 MG TABS tablet [Pharmacy Med Name: TRINTELLIX 20MG  TAB] 90 tablet 0    Sig: TAKE ONE TABLET BY MOUTH EVERY DAY     Psychiatry: Antidepressants - Serotonin Modulator Passed - 05/08/2023  3:29 PM      Passed - Completed PHQ-2 or PHQ-9 in the last 360 days      Passed - Valid encounter within last 6 months    Recent Outpatient Visits           2 months ago Anxiety and depression   [place] [facility] at [facility], Ocie Bob, MD   3 months ago OSA on CPAP   [place] [facility] at [facility], Ocie Bob, MD   10 months ago Primary hypertension   [place] [facility] at [facility], Ocie Bob, MD   1 year ago Annual physical exam   [facility] Primary Care & Sports Medicine at [facility], Ocie Bob, MD   1 year ago Primary hypertension   [place] [facility] Care & Sports Medicine at [facility], Ocie Bob, MD       Future Appointments             In 3 months Ashley Royalty, Ocie Bob, MD [facility] Primary Care & Sports Medicine at [facility], [facility]

## 2023-05-28 ENCOUNTER — Other Ambulatory Visit: Payer: Self-pay | Admitting: Family Medicine

## 2023-05-28 DIAGNOSIS — F5104 Psychophysiologic insomnia: Secondary | ICD-10-CM

## 2023-05-28 DIAGNOSIS — F32A Depression, unspecified: Secondary | ICD-10-CM

## 2023-05-29 NOTE — Telephone Encounter (Signed)
 Requested medication (s) are due for refill today: yes  Requested medication (s) are on the active medication list: yes  Last refill:  05/03/23 #30 2 RF  Future visit scheduled: yes  Notes to clinic:  pharmacy requesting 90 day refill   Requested Prescriptions  Pending Prescriptions Disp Refills   traZODone  (DESYREL ) 50 MG tablet [Pharmacy Med Name: TRAZODONE  50 MG TABLET] 90 tablet 1    Sig: TAKE 1/2 TO 1 TABLET BY MOUTH AT BEDTIME AS NEEDED FOR SLEEP     Psychiatry: Antidepressants - Serotonin Modulator Passed - 05/29/2023  4:26 PM      Passed - Completed PHQ-2 or PHQ-9 in the last 360 days      Passed - Valid encounter within last 6 months    Recent Outpatient Visits           2 months ago Anxiety and depression   [place] [facility] at [facility] Ledger, Dessie Flow, MD   4 months ago OSA on CPAP   [place] [facility] at [facility], Dessie Flow, MD   10 months ago Primary hypertension   [place] [facility] at [facility], Dessie Flow, MD   1 year ago Annual physical exam   [facility] Primary Care & Sports Medicine at [facility], Dessie Flow, MD   1 year ago Primary hypertension   [place] [facility] Care & Sports Medicine at [facility], Dessie Flow, MD       Future Appointments             In 3 months Augustus Ledger, Dessie Flow, MD [facility] Primary Care & Sports Medicine at [facility], [facility]

## 2023-07-23 ENCOUNTER — Other Ambulatory Visit: Payer: Self-pay | Admitting: Family Medicine

## 2023-07-23 DIAGNOSIS — M26609 Unspecified temporomandibular joint disorder, unspecified side: Secondary | ICD-10-CM

## 2023-07-25 ENCOUNTER — Other Ambulatory Visit: Payer: Self-pay | Admitting: Family Medicine

## 2023-07-25 DIAGNOSIS — M26609 Unspecified temporomandibular joint disorder, unspecified side: Secondary | ICD-10-CM

## 2023-07-25 NOTE — Telephone Encounter (Signed)
 Please review.  KP

## 2023-07-25 NOTE — Telephone Encounter (Signed)
 Requested medication (s) are due for refill today: yes  Requested medication (s) are on the active medication list: yes  Last refill:  03/08/23 #30/0  Future visit scheduled: yes  Notes to clinic:  Unable to refill per protocol, cannot delegate.     Requested Prescriptions  Pending Prescriptions Disp Refills   methocarbamol (ROBAXIN) 500 MG tablet [Pharmacy Med Name: METHOCARBAMOL 500 MG TABLET] 30 tablet 0    Sig: TAKE 1 TABLET BY MOUTH EVERY 8 HOURS AS NEEDED FOR MUSCLE SPASMS.     Not Delegated - Analgesics:  Muscle Relaxants Failed - 07/25/2023  9:58 AM      Failed - This refill cannot be delegated      Failed - Valid encounter within last 6 months    Recent Outpatient Visits   None     Future Appointments             In 1 month Ashley Royalty, Ocie Bob, MD [facility] Primary Care & Sports Medicine at [facility], [facility]

## 2023-07-30 ENCOUNTER — Other Ambulatory Visit: Payer: Self-pay | Admitting: Family Medicine

## 2023-07-30 DIAGNOSIS — I10 Essential (primary) hypertension: Secondary | ICD-10-CM

## 2023-08-01 NOTE — Telephone Encounter (Signed)
 Requested Prescriptions  Pending Prescriptions Disp Refills   metoprolol succinate (TOPROL-XL) 25 MG 24 hr tablet [Pharmacy Med Name: METOPROLOL SUCC ER 25 MG TAB] 90 tablet 0    Sig: TAKE 1 TABLET BY MOUTH EVERY DAY     Cardiovascular:  Beta Blockers Failed - 08/01/2023  7:57 AM      Failed - Last BP in normal range    BP Readings from Last 1 Encounters:  03/08/23 (!) 144/90         Failed - Valid encounter within last 6 months    Recent Outpatient Visits   None     Future Appointments             In 3 weeks Jonathon Lozano, Dessie Flow, MD [facility] Primary Care & Sports Medicine at [facility], [facility]            Passed - Last Heart Rate in normal range    Pulse Readings from Last 1 Encounters:  03/08/23 73          amLODipine (NORVASC) 10 MG tablet [Pharmacy Med Name: AMLODIPINE BESYLATE 10 MG TAB] 90 tablet 0    Sig: TAKE 1 TABLET BY MOUTH EVERY DAY     Cardiovascular: Calcium Channel Blockers 2 Failed - 08/01/2023  7:57 AM      Failed - Last BP in normal range    BP Readings from Last 1 Encounters:  03/08/23 (!) 144/90         Failed - Valid encounter within last 6 months    Recent Outpatient Visits   None     Future Appointments             In 3 weeks Jonathon Lozano Dessie Flow, MD [facility] Primary Care & Sports Medicine at [facility], [facility]            Passed - Last Heart Rate in normal range    Pulse Readings from Last 1 Encounters:  03/08/23 73

## 2023-08-23 ENCOUNTER — Ambulatory Visit: Payer: 59 | Admitting: Family Medicine

## 2023-08-28 ENCOUNTER — Encounter: Payer: Self-pay | Admitting: Family Medicine

## 2023-08-28 ENCOUNTER — Other Ambulatory Visit: Payer: Self-pay | Admitting: Family Medicine

## 2023-08-28 ENCOUNTER — Ambulatory Visit (INDEPENDENT_AMBULATORY_CARE_PROVIDER_SITE_OTHER): Payer: Self-pay | Admitting: Family Medicine

## 2023-08-28 VITALS — BP 116/82 | HR 74 | Ht 69.0 in | Wt 268.4 lb

## 2023-08-28 DIAGNOSIS — M26609 Unspecified temporomandibular joint disorder, unspecified side: Secondary | ICD-10-CM

## 2023-08-28 DIAGNOSIS — G4733 Obstructive sleep apnea (adult) (pediatric): Secondary | ICD-10-CM

## 2023-08-28 DIAGNOSIS — R220 Localized swelling, mass and lump, head: Secondary | ICD-10-CM

## 2023-08-28 DIAGNOSIS — F32A Depression, unspecified: Secondary | ICD-10-CM

## 2023-08-28 DIAGNOSIS — R202 Paresthesia of skin: Secondary | ICD-10-CM | POA: Diagnosis not present

## 2023-08-28 DIAGNOSIS — R7303 Prediabetes: Secondary | ICD-10-CM | POA: Insufficient documentation

## 2023-08-28 DIAGNOSIS — I10 Essential (primary) hypertension: Secondary | ICD-10-CM | POA: Diagnosis not present

## 2023-08-28 DIAGNOSIS — M7712 Lateral epicondylitis, left elbow: Secondary | ICD-10-CM

## 2023-08-28 DIAGNOSIS — M7711 Lateral epicondylitis, right elbow: Secondary | ICD-10-CM | POA: Diagnosis not present

## 2023-08-28 DIAGNOSIS — F5104 Psychophysiologic insomnia: Secondary | ICD-10-CM

## 2023-08-28 DIAGNOSIS — F419 Anxiety disorder, unspecified: Secondary | ICD-10-CM

## 2023-08-28 MED ORDER — BUPROPION HCL ER (XL) 300 MG PO TB24: 300.0000 mg | ORAL_TABLET | Freq: Every day | ORAL | 3 refills | Status: AC

## 2023-08-28 NOTE — Assessment & Plan Note (Signed)
 HEENT: No oral mucosal abnormalities. Well-circumscribed, cystic, nontender, mobile mass at left subzygomatic region.  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  Examination reassuring however given chronicity of symptoms and gradual increase in size, referral advised. -Place referral to ENT

## 2023-08-28 NOTE — Assessment & Plan Note (Signed)
 Hypertension Hypertension well-controlled with amlodipine  and metoprolol .  Current BP 116/82 mmHg.

## 2023-08-28 NOTE — Assessment & Plan Note (Signed)
 Lateral Epicondylitis Bilateral lateral epicondylitis, more severe on the left. Pain during resisted wrist extension and supination. - Provide exercises for lateral epicondylitis. - Recommend over-the-counter Voltaren gel for pain relief.  Can use up to 4 times a day on as-needed basis. - Advise to message if symptoms do not improve in 2 weeks to 1 month for further evaluation and potential interventions such as braces or injections.

## 2023-08-28 NOTE — Assessment & Plan Note (Signed)
 Ongoing though improved symptoms at bilateral TMJ, did respond to methocarbamol .  Temporomandibular Joint (TMJ) Syndrome  - Referral to ENT due to ongoing symptoms.

## 2023-08-28 NOTE — Patient Instructions (Signed)
 Patient Plan  Anxiety and Depression:  1. Increase Wellbutrin  dose to 300 mg daily. 2. Continue using trazodone  for sleep, adjusting timing and dosage as discussed. 3. Await referral to psychiatry for further medication management and therapy.  Left Buccal Mass:  1. Referral to ENT for evaluation due to gradual growth and chronicity.  Lateral Epicondylitis:  1. Perform exercises for lateral epicondylitis as instructed. 2. Use Voltaren gel for pain relief, up to 4 times daily as needed. 3. Contact us  if symptoms do not improve in 2 weeks to 1 month for further evaluation.  Left Hand Paresthesia (Ulnar Neuropathy):  1. Use a wrist brace continuously, transitioning to nighttime use as symptoms improve. 2. Make ergonomic adjustments to reduce wrist pressure. 3. Begin the provided home exercises. 4. Contact us  if no improvement in 2 weeks to 1 month for further evaluation.  Obstructive Sleep Apnea (OSA):  - Continue consistent use of CPAP as prescribed.  Prediabetes:  - Maintain lifestyle modifications with gradual changes to improve health.  Hypertension:  - Continue current medications (amlodipine  and metoprolol ) as blood pressure is well-controlled.  Temporomandibular Joint (TMJ) Syndrome:  - Referral to ENT for ongoing TMJ symptoms.  Red Flags:  - Seek immediate medical attention if you experience new or worsening symptoms related to any of the conditions listed.

## 2023-08-28 NOTE — Assessment & Plan Note (Signed)
 Obstructive Sleep Apnea Obstructive sleep apnea managed with consistent CPAP use.

## 2023-08-28 NOTE — Assessment & Plan Note (Signed)
 Prediabetes Prediabetes with A1c of 6.2%. Emphasis on lifestyle modifications. - Encourage continued lifestyle modifications with gradual changes.

## 2023-08-28 NOTE — Progress Notes (Signed)
 Primary Care / Sports Medicine Office Visit  Patient Information:  Patient ID: Jonathon Lozano, male DOB: 22-Feb-1981 Age: 43 y.o. MRN: [id number]   Jonathon Lozano is a pleasant 43 y.o. male presenting with the following:  Chief Complaint  Patient presents with   Hypertension    Patient presents today for a follow up on his blood pressure. He has been taking his medication as directed.    hand numbness    Left hand numbness x 3 months. NKI. Patient does not have any known aggravating factors. He does not have any imaging or tired any treatment so far for his hand.     Vitals:   08/28/23 0909  BP: 116/82  Pulse: 74  SpO2: 95%   Vitals:   08/28/23 0909  Weight: 268 lb 6.4 oz (121.7 kg)  Height: 5\' 9"  (1.753 m)   Body mass index is 39.64 kg/m.  No results found.   Independent interpretation of notes and tests performed by another provider:   None  Procedures performed:   None  Pertinent History, Exam, Impression, and Recommendations:   Problem List Items Addressed This Visit     Anxiety and depression   Depression/anxiety Depression with persistent symptoms despite Trintellix  and Wellbutrin . PHQ score 12, GAD score improved from 14 to 8. Discussed increasing Wellbutrin  dose and potential psychiatric referral delay. - Increase Wellbutrin  dose to 300 mg daily. - Provide referral to psychiatry for further medication management and behavioral therapy. - Encourage continued use of trazodone  for sleep, with adjustments to timing and dosage as discussed.      Relevant Medications   buPROPion  (WELLBUTRIN  XL) 300 MG 24 hr tablet   Other Relevant Orders   Ambulatory referral to Psychiatry   Buccal mass   HEENT: No oral mucosal abnormalities. Well-circumscribed, cystic, nontender, mobile mass at left subzygomatic region.  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  Examination reassuring however given chronicity of symptoms  and gradual increase in size, referral advised. -Place referral to ENT      Relevant Orders   Ambulatory referral to ENT   Lateral epicondylitis of both elbows   Lateral Epicondylitis Bilateral lateral epicondylitis, more severe on the left. Pain during resisted wrist extension and supination. - Provide exercises for lateral epicondylitis. - Recommend over-the-counter Voltaren gel for pain relief.  Can use up to 4 times a day on as-needed basis. - Advise to message if symptoms do not improve in 2 weeks to 1 month for further evaluation and potential interventions such as braces or injections.      Left hand paresthesia   He experiences swelling, redness, and numbness in the fourth and fifth digits of the left hand, extending to part of the palm. Symptoms worsen with activities like driving and typing. A similar issue occurred at the start of COVID, which improved with ergonomic adjustments.  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 Negative Allen's test, however slightly increased duration for reperfusion from ulnar side ulnar blood flow. - Provide a wrist brace for 24/7 use initially, then transition to nighttime use as symptoms improve. - Advise ergonomic adjustments and avoidance of wrist pressure. - Instruct to message if no improvement in 2 weeks to 1 month for further evaluation of potential elbow or neck involvement. - Start home exercises provided.      OSA on CPAP   Obstructive Sleep Apnea Obstructive sleep  apnea managed with consistent CPAP use.      Prediabetes   Prediabetes Prediabetes with A1c of 6.2%. Emphasis on lifestyle modifications. - Encourage continued lifestyle modifications with gradual changes.      Primary hypertension - Primary   Hypertension Hypertension well-controlled with amlodipine  and metoprolol .  Current BP 116/82 mmHg.      Psychophysiological insomnia   TMJ dysfunction    Ongoing though improved symptoms at bilateral TMJ, did respond to methocarbamol .  Temporomandibular Joint (TMJ) Syndrome  - Referral to ENT due to ongoing symptoms.      Relevant Orders   Ambulatory referral to ENT   I provided a total time of 47 minutes including both face-to-face and non-face-to-face time on 08/28/2023 inclusive of time utilized for medical chart review, information gathering, care coordination with staff, and documentation completion.   Orders & Medications Medications:  Meds ordered this encounter  Medications   buPROPion  (WELLBUTRIN  XL) 300 MG 24 hr tablet    Sig: Take 1 tablet (300 mg total) by mouth daily.    Dispense:  90 tablet    Refill:  3   Orders Placed This Encounter  Procedures   Ambulatory referral to Psychiatry   Ambulatory referral to ENT     Return in about 29 weeks (around 03/18/2024) for CPE.     Ma Saupe, MD, [facility]   Primary Care Sports Medicine Primary Care and Sports Medicine at [facility]

## 2023-08-28 NOTE — Assessment & Plan Note (Signed)
 He experiences swelling, redness, and numbness in the fourth and fifth digits of the left hand, extending to part of the palm. Symptoms worsen with activities like driving and typing. A similar issue occurred at the start of COVID, which improved with ergonomic adjustments.  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 Negative Allen's test, however slightly increased duration for reperfusion from ulnar side ulnar blood flow. - Provide a wrist brace for 24/7 use initially, then transition to nighttime use as symptoms improve. - Advise ergonomic adjustments and avoidance of wrist pressure. - Instruct to message if no improvement in 2 weeks to 1 month for further evaluation of potential elbow or neck involvement. - Start home exercises provided.

## 2023-08-28 NOTE — Assessment & Plan Note (Signed)
 Depression/anxiety Depression with persistent symptoms despite Trintellix  and Wellbutrin . PHQ score 12, GAD score improved from 14 to 8. Discussed increasing Wellbutrin  dose and potential psychiatric referral delay. - Increase Wellbutrin  dose to 300 mg daily. - Provide referral to psychiatry for further medication management and behavioral therapy. - Encourage continued use of trazodone  for sleep, with adjustments to timing and dosage as discussed.

## 2023-08-30 NOTE — Telephone Encounter (Signed)
 Requested Prescriptions  Pending Prescriptions Disp Refills   TRINTELLIX  20 MG TABS tablet [Pharmacy Med Name: TRINTELLIX  20MG  TAB] 90 tablet 2    Sig: TAKE ONE TABLET BY MOUTH EVERY DAY     Psychiatry: Antidepressants - Serotonin Modulator Passed - 08/30/2023 12:26 PM      Passed - Completed PHQ-2 or PHQ-9 in the last 360 days      Passed - Valid encounter within last 6 months    Recent Outpatient Visits           2 days ago Primary hypertension   [place] [facility] at [facility], Dessie Flow, MD       Future Appointments             In 6 months Augustus Ledger, Dessie Flow, MD [facility] Primary Care & Sports Medicine at [facility], [facility]

## 2023-11-22 ENCOUNTER — Other Ambulatory Visit: Payer: Self-pay | Admitting: Family Medicine

## 2023-11-22 DIAGNOSIS — F32A Depression, unspecified: Secondary | ICD-10-CM

## 2023-11-22 DIAGNOSIS — F5104 Psychophysiologic insomnia: Secondary | ICD-10-CM

## 2023-11-23 ENCOUNTER — Other Ambulatory Visit: Payer: Self-pay | Admitting: Family Medicine

## 2023-11-23 DIAGNOSIS — I10 Essential (primary) hypertension: Secondary | ICD-10-CM

## 2023-11-23 NOTE — Telephone Encounter (Signed)
 Requested Prescriptions  Pending Prescriptions Disp Refills   traZODone  (DESYREL ) 50 MG tablet [Pharmacy Med Name: TRAZODONE  50 MG TABLET] 90 tablet 1    Sig: TAKE 1/2 TO 1 TABLET BY MOUTH AT BEDTIME AS NEEDED FOR SLEEP     Psychiatry: Antidepressants - Serotonin Modulator Passed - 11/23/2023  3:42 PM      Passed - Completed PHQ-2 or PHQ-9 in the last 360 days      Passed - Valid encounter within last 6 months    Recent Outpatient Visits           2 months ago Primary hypertension   [place] [facility] Medicine at [facility], Selinda PARAS, MD       Future Appointments             In 3 months Jonathon Lozano, Selinda PARAS, MD [facility] Primary Care & Sports Medicine at [facility], [facility]

## 2023-11-24 NOTE — Telephone Encounter (Signed)
 Requested Prescriptions  Pending Prescriptions Disp Refills   amLODipine  (NORVASC ) 10 MG tablet [Pharmacy Med Name: AMLODIPINE  BESYLATE 10 MG TAB] 90 tablet 1    Sig: TAKE 1 TABLET BY MOUTH EVERY DAY     Cardiovascular: Calcium Channel Blockers 2 Passed - 11/24/2023  4:52 PM      Passed - Last BP in normal range    BP Readings from Last 1 Encounters:  08/28/23 116/82         Passed - Last Heart Rate in normal range    Pulse Readings from Last 1 Encounters:  08/28/23 74         Passed - Valid encounter within last 6 months    Recent Outpatient Visits           2 months ago Primary hypertension   [place] [facility] at [facility] Alvia, Selinda PARAS, MD       Future Appointments             In 3 months Alvia, Selinda PARAS, MD [facility] Primary Care & Sports Medicine at [facility], [facility]             metoprolol  succinate (TOPROL -XL) 25 MG 24 hr tablet [Pharmacy Med Name: METOPROLOL  SUCC ER 25 MG TAB] 90 tablet 1    Sig: TAKE 1 TABLET BY MOUTH EVERY DAY     Cardiovascular:  Beta Blockers Passed - 11/24/2023  4:52 PM      Passed - Last BP in normal range    BP Readings from Last 1 Encounters:  08/28/23 116/82         Passed - Last Heart Rate in normal range    Pulse Readings from Last 1 Encounters:  08/28/23 74         Passed - Valid encounter within last 6 months    Recent Outpatient Visits           2 months ago Primary hypertension   [place] [facility] Sports Medicine at [facility], Selinda PARAS, MD       Future Appointments             In 3 months Alvia, Selinda PARAS, MD [facility] Primary Care & Sports Medicine at [facility], [facility]

## 2024-02-01 ENCOUNTER — Ambulatory Visit

## 2024-02-01 ENCOUNTER — Ambulatory Visit
Admission: EM | Admit: 2024-02-01 | Discharge: 2024-02-01 | Disposition: A | Discharge status: Home / Self Care | Attending: Emergency Medicine | Admitting: Emergency Medicine

## 2024-02-01 DIAGNOSIS — T07XXXA Unspecified multiple injuries, initial encounter: Secondary | ICD-10-CM | POA: Diagnosis not present

## 2024-02-01 DIAGNOSIS — Z23 Encounter for immunization: Secondary | ICD-10-CM

## 2024-02-01 DIAGNOSIS — M25521 Pain in right elbow: Secondary | ICD-10-CM

## 2024-02-01 DIAGNOSIS — W19XXXA Unspecified fall, initial encounter: Secondary | ICD-10-CM

## 2024-02-01 DIAGNOSIS — M25421 Effusion, right elbow: Secondary | ICD-10-CM

## 2024-02-01 MED ORDER — CEPHALEXIN 500 MG PO CAPS: 500.0000 mg | ORAL_CAPSULE | Freq: Three times a day (TID) | ORAL | 0 refills | Status: AC

## 2024-02-01 MED ORDER — HYDROCODONE-ACETAMINOPHEN 5-325 MG PO TABS
1.0000 | ORAL_TABLET | Freq: Four times a day (QID) | ORAL | 0 refills | Status: AC | PRN
Start: 2024-02-01 — End: 2024-02-04

## 2024-02-01 MED ORDER — TETANUS-DIPHTH-ACELL PERTUSSIS 5-2-15.5 LF-MCG/0.5 IM SUSP
0.5000 mL | Freq: Once | INTRAMUSCULAR | Status: AC
  Administered 2024-02-01: 0.5 mL via INTRAMUSCULAR

## 2024-02-01 MED ORDER — MUPIROCIN 2 % EX OINT
1.0000 | TOPICAL_OINTMENT | Freq: Two times a day (BID) | CUTANEOUS | 0 refills | Status: AC
Start: 2024-02-01 — End: 2024-02-08

## 2024-02-01 MED ORDER — ACETAMINOPHEN 325 MG PO TABS
975.0000 mg | ORAL_TABLET | Freq: Once | ORAL | Status: AC
  Administered 2024-02-01: 975 mg via ORAL

## 2024-02-01 NOTE — Discharge Instructions (Addendum)
 Your xrays were concerning for right radial neck nondisplaced fracture(elbow), joint effusion of elbow, we are splinting the right arm.  Rest,ice,take Vicodin as label directed for pain Apply mupirocin ointment to affected area daily,change dressings daily and as soiled used telfa so dressing doesn't stick to wound.Take keflex as directed. If you have new or worsening issues or concerns go to ER for further evaluation.  [facility]: [address] [place], [place] [address] Phone: [phone] Urgent care hours 8a-7:30p Mon-Sat

## 2024-02-01 NOTE — ED Triage Notes (Signed)
 Pt c/o injury to right shoulder, forearm, palms, and shin  Pt states that he was walking his dog when it broke away to chase deer, the patient gave chase and tripped twice, with abrasions

## 2024-02-01 NOTE — ED Provider Notes (Signed)
 [facility]    CSN: [id number] Arrival date & time: 02/01/24  1916      History   Chief Complaint Chief Complaint  Patient presents with   Fall    HPI Jonathon Lozano is a 43 y.o. male.   43 year old male pt, Jonathon Lozano, presents to urgent care for complaint of injury to right UE,right lower extremity and palms after walking dog that broke away to chase deer, pt tripped and fell on asphalt twice trying to catch jack russel/beagle mix dog. Pt has multiple abrasions. Tetanus is not UTD. No meds or treatment tried PTA. Pt denies head strike or LOC.   Pt states right elbow is most painful along with right thumb.   The history is provided by the patient. No language interpreter was used.    Past Medical History:  Diagnosis Date   Anxiety    Arthralgia of right temporomandibular joint 09/29/2020   Asthma    as a child   Attention deficit hyperactivity disorder (ADHD)    Complications affecting other specified body systems, hypertension 09/29/2020   Depression    GERD (gastroesophageal reflux disease)    HLD (hyperlipidemia)    Hypertension    Tremor of both hands     Patient Active Problem List   Diagnosis Date Noted   Abrasions of multiple sites 02/01/2024   Fall 02/01/2024   Right elbow pain 02/01/2024   Elbow joint effusion, right 02/01/2024   Left hand paresthesia 08/28/2023   Lateral epicondylitis of both elbows 08/28/2023   Prediabetes 08/28/2023   Buccal mass 08/28/2023   Healthcare maintenance 03/08/2023   OSA on CPAP 01/18/2023   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 No pertinent surgical history.     Home Medications    Prior to  Admission medications   Medication Sig Start Date End Date Taking? Authorizing Provider  amLODipine  (NORVASC ) 10 MG tablet TAKE 1 TABLET BY MOUTH EVERY DAY 11/24/23  Yes Matthews, Jason J, MD  buPROPion  (WELLBUTRIN  XL) 300 MG 24 hr tablet Take 1 tablet (300 mg total) by mouth daily. 08/28/23  Yes Matthews, Jason J, MD  cephALEXin (KEFLEX) 500 MG capsule Take 1 capsule (500 mg total) by mouth 3 (three) times daily for 7 days. 02/01/24 02/08/24 Yes Anthonette Lesage, NP  cetirizine (ZYRTEC) 10 MG tablet Take 10 mg by mouth daily.   Yes [provider]  doxylamine, Sleep, (UNISOM) 25 MG tablet Take 25 mg by mouth at bedtime as needed.   Yes [provider]  famotidine -calcium carbonate-magnesium hydroxide (PEPCID  COMPLETE) 10-800-165 MG chewable tablet Chew 1 tablet by mouth daily as needed.   Yes [provider]  HYDROcodone -acetaminophen  (NORCO/VICODIN) 5-325 MG tablet Take 1 tablet by mouth every 6 (six) hours as needed for up to 3 days for moderate pain (pain score 4-6). 02/01/24 02/04/24 Yes Kenady Doxtater, Rilla, NP  melatonin 5 MG TABS Take 5 mg by mouth at bedtime.   Yes [provider]  methocarbamol  (ROBAXIN ) 500 MG tablet TAKE 1 TABLET BY MOUTH EVERY 8 HOURS AS NEEDED FOR MUSCLE SPASMS. 07/25/23  Yes Matthews, Jason J, MD  metoprolol  succinate (TOPROL -XL) 25 MG 24 hr tablet TAKE 1 TABLET BY MOUTH EVERY DAY 11/24/23  Yes Alvia,  Jason J, MD  mupirocin ointment (BACTROBAN) 2 % Apply 1 Application topically 2 (two) times daily for 7 days. To affected areas 02/01/24 02/08/24 Yes Maddisyn Hegwood, Rilla, NP  traZODone  (DESYREL ) 50 MG tablet TAKE 1/2 TO 1 TABLET BY MOUTH AT BEDTIME AS NEEDED FOR SLEEP 11/23/23  Yes Alvia Selinda PARAS, MD  TRINTELLIX  20 MG TABS tablet TAKE ONE TABLET BY MOUTH EVERY DAY 08/30/23  Yes Alvia Selinda PARAS, MD    Family History Family History  Problem Relation Age of Onset   Hypertension Mother    Hyperlipidemia Father    Hypertension Father     Mental retardation Brother    Dementia Maternal Grandmother    Stroke Paternal Grandmother    Pancreatic cancer Paternal Grandmother 23 - 48       early 52s   Stroke Paternal Grandfather    Colon cancer Maternal Uncle 50   Colon cancer Maternal Great-grandfather     Social History Social History   Tobacco Use   Smoking status: Former    Current packs/day: 0.00    Average packs/day: 0.5 packs/day for 13.0 years (6.5 ttl pk-yrs)    Types: Cigarettes    Start date: 2004    Quit date: 2017    Years since quitting: 8.7   Smokeless tobacco: Never  Vaping Use   Vaping status: Every Day   Substances: Nicotine, Flavoring  Substance Use Topics   Alcohol use: Not Currently   Drug use: Not Currently     Allergies   Patient has no known allergies.   Review of Systems Review of Systems  Musculoskeletal:  Positive for arthralgias, joint swelling and myalgias.  Skin:  Positive for color change and wound.  All other systems reviewed and are negative.    Physical Exam Triage Vital Signs ED Triage Vitals  Encounter Vitals Group     BP 02/01/24 1925 (!) 140/93     Girls Systolic BP Percentile --      Girls Diastolic BP Percentile --      Boys Systolic BP Percentile --      Boys Diastolic BP Percentile --      Pulse Rate 02/01/24 1925 (!) 104     Resp --      Temp --      Temp Source 02/01/24 1925 Oral     SpO2 02/01/24 1925 97 %     Weight --      Height --      Head Circumference --      Peak Flow --      Pain Score 02/01/24 1931 7     Pain Loc --      Pain Education --      Exclude from Growth Chart --    No data found.  Updated Vital Signs BP (!) 140/93 (BP Location: Left Arm)   Pulse (!) 104   Temp 99.7 F (37.6 C) (Oral)   Wt 254 lb 3.2 oz (115.3 kg)   SpO2 97%   BMI 37.54 kg/m   Visual Acuity Right Eye Distance:   Left Eye Distance:   Bilateral Distance:    Right Eye Near:   Left Eye Near:    Bilateral Near:     Physical Exam Vitals and  nursing note reviewed.  Constitutional:      Appearance: Normal appearance. He is well-developed and well-groomed.  Cardiovascular:     Rate and Rhythm: Normal rate.     Pulses:  Radial pulses are 2+ on the right side and 2+ on the left side.  Pulmonary:     Effort: Pulmonary effort is normal.  Musculoskeletal:     Right shoulder: Tenderness present. No bony tenderness. Normal pulse.     Comments: No joint tenderness, tenderness only with associated abrasions, clavicle nontender  Skin:    General: Skin is warm.     Capillary Refill: Capillary refill takes less than 2 seconds.     Findings: Abrasion, signs of injury and wound present.     Comments: See photos in chart  Neurological:     General: No focal deficit present.     Mental Status: He is alert and oriented to person, place, and time.     GCS: GCS eye subscore is 4. GCS verbal subscore is 5. GCS motor subscore is 6.     Comments: Good distal sensation and movement of fingers  Psychiatric:        Attention and Perception: Attention normal.        Mood and Affect: Mood normal.        Speech: Speech normal.        Behavior: Behavior normal. Behavior is cooperative.               UC Treatments / Results  Labs (all labs ordered are listed, but only abnormal results are displayed) Labs Reviewed - No data to display  EKG   Radiology DG Finger Thumb Right Result Date: 02/01/2024 CLINICAL DATA:  Fall, pain EXAM: RIGHT THUMB 2+V COMPARISON:  None Available. FINDINGS: There is no evidence of fracture or dislocation. There is no evidence of arthropathy or other focal bone abnormality. Soft tissues are unremarkable. IMPRESSION: Negative. Electronically Signed   By: Franky Crease M.D.   On: 02/01/2024 19:54   DG Elbow Complete Right Result Date: 02/01/2024 CLINICAL DATA:  Fall EXAM: RIGHT ELBOW - COMPLETE 3+ VIEW COMPARISON:  None Available. FINDINGS: Right elbow joint effusion. Cortical buckling noted in the  radial neck region concerning for nondisplaced fracture. No subluxation or dislocation. IMPRESSION: Right elbow joint effusion. Cortical buckling in the radial neck region concerning for nondisplaced fracture. Electronically Signed   By: Franky Crease M.D.   On: 02/01/2024 19:53    Procedures Procedures (including critical care time)  Medications Ordered in UC Medications  acetaminophen  (TYLENOL ) tablet 975 mg (975 mg Oral Given 02/01/24 1949)  Tdap (ADACEL) injection 0.5 mL (0.5 mLs Intramuscular Given 02/01/24 1950)    Initial Impression / Assessment and Plan / UC Course  I have reviewed the triage vital signs and the nursing notes.  Pertinent labs & imaging results that were available during my care of the patient were reviewed by me and considered in my medical decision making (see chart for details).  Clinical Course as of 02/01/24 2047  Thu Feb 01, 2024  2000 Radial neck nondisplaced fracture,pt aware, will splint(posterior long arm)/sling [JD]    Clinical Course User Index [JD] Bronsen Serano, Rilla, NP   Discussed exam findings and plan of care with patient and caregiver, posterior long-arm splint applied to right upper extremity, neurovascularly intact pre and post application of splint, wounds cleaned and dressed in office, scripted Vicodin for pain, mupirocin and Keflex due to multiple abrasions.  Tetanus was updated, strict go to ER precautions given, patient is to follow-up with orthopedics tomorrow a.m. if unable to get in with orthopedics will need to go to the emergency room for further evaluation and management of right elbow  fracture joint effusion, patient and caregiver verbalized understanding to this provider, work note given.  Ddx: Fall,multiple abrasions,fracture dislocation Final Clinical Impressions(s) / UC Diagnoses   Final diagnoses:  Abrasions of multiple sites  Fall, initial encounter  Right elbow pain  Elbow joint effusion, right     Discharge  Instructions      Your xrays were concerning for right radial neck nondisplaced fracture(elbow), joint effusion of elbow, we are splinting the right arm.  Rest,ice,take Vicodin as label directed for pain Apply mupirocin ointment to affected area daily,change dressings daily and as soiled used telfa so dressing doesn't stick to wound.Take keflex as directed. If you have new or worsening issues or concerns go to ER for further evaluation.  [facility]: [address] [place], [place] [address] Phone: [phone] Urgent care hours 8a-7:30p Mon-Sat      ED Prescriptions     Medication Sig Dispense Auth. Provider   cephALEXin (KEFLEX) 500 MG capsule Take 1 capsule (500 mg total) by mouth 3 (three) times daily for 7 days. 20 capsule Zali Kamaka, NP   mupirocin ointment (BACTROBAN) 2 % Apply 1 Application topically 2 (two) times daily for 7 days. To affected areas 15 g Allison Silva, NP   HYDROcodone -acetaminophen  (NORCO/VICODIN) 5-325 MG tablet Take 1 tablet by mouth every 6 (six) hours as needed for up to 3 days for moderate pain (pain score 4-6). 10 tablet Jshaun Abernathy, Rilla, NP      I have reviewed the PDMP during this encounter.   Aminta Rilla, NP 02/01/24 2048

## 2024-03-18 ENCOUNTER — Other Ambulatory Visit: Payer: Self-pay | Admitting: Family Medicine

## 2024-03-18 ENCOUNTER — Ambulatory Visit: Admitting: Family Medicine

## 2024-03-18 ENCOUNTER — Encounter: Payer: Self-pay | Admitting: Family Medicine

## 2024-03-18 VITALS — BP 116/76 | HR 88 | Temp 98.8°F | Ht 69.0 in | Wt 267.0 lb

## 2024-03-18 DIAGNOSIS — K2100 Gastro-esophageal reflux disease with esophagitis, without bleeding: Secondary | ICD-10-CM

## 2024-03-18 DIAGNOSIS — Z6839 Body mass index (BMI) 39.0-39.9, adult: Secondary | ICD-10-CM

## 2024-03-18 DIAGNOSIS — R220 Localized swelling, mass and lump, head: Secondary | ICD-10-CM | POA: Diagnosis not present

## 2024-03-18 DIAGNOSIS — F5232 Male orgasmic disorder: Secondary | ICD-10-CM

## 2024-03-18 DIAGNOSIS — Z23 Encounter for immunization: Secondary | ICD-10-CM | POA: Diagnosis not present

## 2024-03-18 DIAGNOSIS — M26609 Unspecified temporomandibular joint disorder, unspecified side: Secondary | ICD-10-CM

## 2024-03-18 DIAGNOSIS — N528 Other male erectile dysfunction: Secondary | ICD-10-CM | POA: Insufficient documentation

## 2024-03-18 DIAGNOSIS — I10 Essential (primary) hypertension: Secondary | ICD-10-CM

## 2024-03-18 DIAGNOSIS — G4733 Obstructive sleep apnea (adult) (pediatric): Secondary | ICD-10-CM

## 2024-03-18 DIAGNOSIS — E66812 Obesity, class 2: Secondary | ICD-10-CM | POA: Diagnosis not present

## 2024-03-18 DIAGNOSIS — H9313 Tinnitus, bilateral: Secondary | ICD-10-CM | POA: Diagnosis not present

## 2024-03-18 DIAGNOSIS — R7303 Prediabetes: Secondary | ICD-10-CM

## 2024-03-18 DIAGNOSIS — Z0000 Encounter for general adult medical examination without abnormal findings: Secondary | ICD-10-CM | POA: Diagnosis not present

## 2024-03-18 DIAGNOSIS — F32A Depression, unspecified: Secondary | ICD-10-CM

## 2024-03-18 DIAGNOSIS — E559 Vitamin D deficiency, unspecified: Secondary | ICD-10-CM | POA: Insufficient documentation

## 2024-03-18 DIAGNOSIS — E662 Morbid (severe) obesity with alveolar hypoventilation: Secondary | ICD-10-CM

## 2024-03-18 DIAGNOSIS — F5104 Psychophysiologic insomnia: Secondary | ICD-10-CM

## 2024-03-18 DIAGNOSIS — F419 Anxiety disorder, unspecified: Secondary | ICD-10-CM

## 2024-03-18 MED ORDER — ZEPBOUND 2.5 MG/0.5ML ~~LOC~~ SOAJ: 2.5000 mg | SUBCUTANEOUS | 2 refills | Status: DC

## 2024-03-18 MED ORDER — TADALAFIL 2.5 MG PO TABS: 2.5000 | ORAL_TABLET | Freq: Every day | ORAL | 2 refills | Status: AC

## 2024-03-18 NOTE — Progress Notes (Signed)
 Annual Physical Exam Visit  Patient Information:  Patient ID: Jonathon Lozano, male DOB: [birth year] Age: 43 y.o. MRN: [id number]   Subjective:   CC: Annual Physical Exam  HPI:  Jonathon Lozano is here for their annual physical.  I reviewed the past medical history, family history, social history, surgical history, and allergies today and changes were made as necessary.  Please see the problem list section below for additional details.  Past Medical History: Past Medical History:  Diagnosis Date   Anxiety    Arthralgia of right temporomandibular joint 09/29/2020   Asthma    as a child   Attention deficit hyperactivity disorder (ADHD)    Complications affecting other specified body systems, hypertension 09/29/2020   Depression    GERD (gastroesophageal reflux disease)    HLD (hyperlipidemia)    Hypertension    Tremor of both hands    Past Surgical History: History reviewed. No pertinent surgical history. Family History: Family History  Problem Relation Age of Onset   Hypertension Mother    Hyperlipidemia Father    Hypertension Father    Mental retardation Brother    Dementia Maternal Grandmother    Stroke Paternal Grandmother    Pancreatic cancer Paternal Grandmother 1 - 64       early 39s   Stroke Paternal Grandfather    Colon cancer Maternal Uncle 50   Colon cancer Maternal Great-grandfather    Allergies: No Known Allergies Health Maintenance: Health Maintenance  Topic Date Due   Hepatitis B Vaccines 19-59 Average Risk (1 of 3 - 19+ 3-dose series) Never done   HPV VACCINES (1 - 3-dose SCDM series) Never done   DTaP/Tdap/Td (3 - Td or Tdap) 01/31/2034   Influenza Vaccine  Completed   Hepatitis C Screening  Completed   HIV Screening  Completed   Pneumococcal Vaccine  Aged Out   Meningococcal B Vaccine  Aged Out   COVID-19 Vaccine  Discontinued    HM Colonoscopy   This patient has no relevant Health Maintenance data.     Medications: Current Outpatient Medications on File Prior to Visit  Medication Sig Dispense Refill   amLODipine  (NORVASC ) 10 MG tablet TAKE 1 TABLET BY MOUTH EVERY DAY 90 tablet 1   buPROPion  (WELLBUTRIN  XL) 300 MG 24 hr tablet Take 1 tablet (300 mg total) by mouth daily. 90 tablet 3   cetirizine (ZYRTEC) 10 MG tablet Take 10 mg by mouth daily.     doxylamine, Sleep, (UNISOM) 25 MG tablet Take 25 mg by mouth at bedtime as needed.     famotidine -calcium carbonate-magnesium hydroxide (PEPCID  COMPLETE) 10-800-165 MG chewable tablet Chew 1 tablet by mouth daily as needed.     melatonin 5 MG TABS Take 5 mg by mouth at bedtime.     methocarbamol  (ROBAXIN ) 500 MG tablet TAKE 1 TABLET BY MOUTH EVERY 8 HOURS AS NEEDED FOR MUSCLE SPASMS. 30 tablet 0   metoprolol  succinate (TOPROL -XL) 25 MG 24 hr tablet TAKE 1 TABLET BY MOUTH EVERY DAY 90 tablet 1   traZODone  (DESYREL ) 50 MG tablet TAKE 1/2 TO 1 TABLET BY MOUTH AT BEDTIME AS NEEDED FOR SLEEP 90 tablet 1   TRINTELLIX  20 MG TABS tablet TAKE ONE TABLET BY MOUTH EVERY DAY 90 tablet 2   No current facility-administered medications on file prior to visit.    Discussed the use of AI scribe software for clinical note transcription with the patient, who gave verbal consent to proceed.   Objective:  Vitals:   03/18/24 0906  BP: 116/76  Pulse: 88  Temp: 98.8 F (37.1 C)  SpO2: 96%   Vitals:   03/18/24 0906  Weight: 267 lb (121.1 kg)  Height: 5' 9 (1.753 m)   Body mass index is 39.43 kg/m.  General: Well Developed, well nourished, and in no acute distress.  Neuro: Alert and oriented x3, extra-ocular muscles intact, sensation grossly intact. Cranial nerves II through XII are grossly intact, motor, sensory, and coordinative functions are intact. HEENT: Normocephalic, atraumatic, neck supple, no masses, no lymphadenopathy, thyroid  nonenlarged. Oropharynx, nasopharynx with swollen turbinates with clear mucus bilaterally, external ear canals and TMs  are unremarkable. Skin: Warm and dry, no rashes noted.  Cardiac: Regular rate and rhythm, no murmurs rubs or gallops. No peripheral edema. Pulses symmetric. Respiratory: Clear to auscultation bilaterally. Speaking in full sentences.  Abdominal: Soft, nontender, nondistended, positive bowel sounds, no masses, no organomegaly. Musculoskeletal: Stable, and with full range of motion.  Impression and Recommendations:   History of Present Illness Jonathon Lozano is a 43 year old male who presents for an annual physical exam and follow-up on multiple health issues.  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 - Gradual growth over time - No evaluation by ENT specialist despite previous referral  Gastroesophageal reflux symptoms - Intermittent gastroesophageal reflux, with increased frequency recently - Currently using Pepcid  Complete for symptom control - Requires higher dose for effective management  Musculoskeletal symptoms - History of tennis elbow, significantly improved since previous accident - Left hand numbness has improved but persists - Not currently using a brace, plans to resume use  Sleep disturbance - Uses CPAP machine for moderate sleep apnea, compliant with therapy - Experiences insomnia, occasionally uses trazodone  for sleep - Trazodone  is effective but causes morning grogginess, even at half dose  Psychological stress and sexual dysfunction - Taking Wellbutrin  300 mg daily for stress, with reported benefit - Difficulty maintaining erections and reaching orgasm - Attributes sexual dysfunction to a combination of factors including stress  Results DIAGNOSTIC Sleep study: Moderate  obstructive sleep apnea  Assessment and Plan General adult medical examination Routine physical examination with discussion of recent labs and health status. - Ordered labs to assess current health status.  Obstructive sleep apnea, moderate, on CPAP and weight management Moderate obstructive sleep apnea managed with CPAP. Discussed potential use of Zepbound for weight management to aid in sleep apnea treatment. Zepbound is FDA approved for sleep apnea and may improve mood, cholesterol, and joint pain. Insurance coverage may be facilitated by sleep apnea diagnosis. - Sent prescription for Zepbound to pharmacy. - Will complete prior authorization paperwork for Zepbound. - Will schedule follow-up in three months if Zepbound is approved.  Hyperlipidemia Cholesterol levels have improved significantly, with LDL slightly elevated. Continued monitoring is necessary. - Continue current management and monitoring of cholesterol levels.  Prediabetes A1c in prediabetes range. No medication required, but monitoring is necessary. - Continue monitoring A1c levels.  Essential hypertension Blood pressure well-controlled at 116/76 mmHg. - Continue current management and monitoring of blood pressure.  Vitamin D  deficiency Vitamin D  levels slightly low. Reassessment is necessary. - Will recheck vitamin D  levels.  Gastroesophageal reflux disease with esophagitis Intermittent reflux symptoms, possibly exacerbated  by dietary factors. Current management with Pepcid  Complete is partially effective. - Increase Pepcid  Complete to 20 mg if needed. - Take Pepcid  30 minutes before meals on an empty stomach. - Will consider prescription-grade medication if symptoms persist.  Tinnitus and temporomandibular joint disorder Tinnitus more noticeable over the past 2-3 months. Possible TMJ involvement. No recent exposure to loud noises. ENT referral is necessary for further evaluation. - Referred to ENT for evaluation  of tinnitus and TMJ. - Consider saline rinses and intranasal steroids for potential allergic rhinitis.  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 ENT evaluation is necessary. - Referred to ENT for evaluation of buccal mass.  Allergic rhinitis Possible allergic rhinitis contributing to tinnitus. Nasal examination shows active turbinates. - Start saline nasal rinses. - Use intranasal steroid spray such as Flonase.  Insomnia Improved with bedtime routine. Trazodone  used as needed but causes morning grogginess. - Try taking trazodone  earlier in the evening. - Consider alternative non-habit forming medications if needed.  Major depressive disorder Wellbutrin  300 mg daily is well-tolerated. Stress levels have improved. - Continue Wellbutrin  300 mg daily.  Erectile dysfunction Difficulty maintaining erection. Discussed potential use of Cialis for daily management. Physical activity and stress management are recommended. - Prescribed Cialis for daily use. - Encouraged physical activity and stress management.  Paresthesia, left hand (improving) Left hand numbness has improved but not fully resolved. No recent use of brace. - Encouraged use of brace as needed. - Recommended flexibility exercises for elbow and wrist.  Abnormal kidney function (mild, monitored) Mildly elevated creatinine, possibly due to inflammation or medication use. Previous fluctuations noted. - Continue monitoring kidney function.  The patient was counselled, risk factors were discussed, and anticipatory guidance given.  Problem List Items Addressed This Visit     Gastroesophageal reflux disease with esophagitis without hemorrhage   Healthcare maintenance - Primary   Relevant Orders   CBC   Comprehensive metabolic panel with GFR   Hemoglobin A1c   Lipid panel   VITAMIN D  25 Hydroxy (Vit-D Deficiency, Fractures)   PSA Total (Reflex To Free)   Hyperlipidemia   Relevant  Medications   Tadalafil 2.5 MG TABS   Lateral epicondylitis of both elbows   Left hand paresthesia   OSA on CPAP   AHI 3%: 28.5/hr - Moderate OSA AHI 4%: 20.8/hr      Relevant Medications   tirzepatide (ZEPBOUND) 2.5 MG/0.5ML Pen   Prediabetes   Relevant Orders   Hemoglobin A1c   Primary hypertension   Relevant Medications   Tadalafil 2.5 MG TABS   Other Relevant Orders   Lipid panel   Other Visit Diagnoses       Vitamin D  deficiency       Relevant Orders   VITAMIN D  25 Hydroxy (Vit-D Deficiency, Fractures)     Azotemia       Relevant Orders   Comprehensive metabolic panel with GFR     Encounter for immunization       Relevant Orders   Flu vaccine trivalent PF, 6mos and older(Flulaval,Afluria,Fluarix,Fluzone) (Completed)     Benign prostatic hyperplasia, unspecified whether lower urinary tract symptoms present       Relevant Orders   PSA Total (Reflex To Free)     Other male erectile dysfunction       Relevant Medications   Tadalafil 2.5 MG TABS        Orders & Medications Medications:  Meds ordered this encounter  Medications  Tadalafil 2.5 MG TABS    Sig: Take 2.5 tablets (6.25 mg total) by mouth daily.    Dispense:  30 tablet    Refill:  2   tirzepatide (ZEPBOUND) 2.5 MG/0.5ML Pen    Sig: Inject 2.5 mg into the skin once a week.    Dispense:  2 mL    Refill:  2   Orders Placed This Encounter  Procedures   Flu vaccine trivalent PF, 6mos and older(Flulaval,Afluria,Fluarix,Fluzone)   CBC   Comprehensive metabolic panel with GFR   Hemoglobin A1c   Lipid panel   VITAMIN D  25 Hydroxy (Vit-D Deficiency, Fractures)   PSA Total (Reflex To Free)     No follow-ups on file.    Selinda JINNY Ku, MD, [facility]   Primary Care Sports Medicine Primary Care and Sports Medicine at [facility]

## 2024-03-18 NOTE — Assessment & Plan Note (Signed)
 AHI 3%: 28.5/hr - Moderate OSA AHI 4%: 20.8/hr

## 2024-03-18 NOTE — Patient Instructions (Addendum)
-   Obtain fasting labs with orders provided (can have water or black coffee but otherwise no food or drink x 8 hours before labs) - Review information provided - Attend eye doctor annually, dentist every 6 months, work towards or maintain 30 minutes of moderate intensity physical activity at least 5 days per week, and consume a balanced diet - Return in 1 year for physical - Contact us  for any questions between now and then   VISIT SUMMARY:  Today, you had your annual physical exam and follow-up on several health issues. We discussed your tinnitus, facial mass, gastroesophageal reflux, musculoskeletal symptoms, sleep disturbance, psychological stress, and sexual dysfunction. We also reviewed your recent lab results and made plans for further management.  YOUR PLAN:  OBSTRUCTIVE SLEEP APNEA: Moderate obstructive sleep apnea managed with CPAP. Discussed potential use of Zepbound for weight management. -Sent prescription for Zepbound to pharmacy. -Will complete prior authorization paperwork for Zepbound. -Schedule follow-up in three months if Zepbound is approved.  HYPERLIPIDEMIA: Cholesterol levels have improved significantly, with LDL slightly elevated. -Continue current management and monitoring of cholesterol levels.  PREDIABETES: A1c in prediabetes range. No medication required, but monitoring is necessary. -Continue monitoring A1c levels.  ESSENTIAL HYPERTENSION: Blood pressure well-controlled at 116/76 mmHg. -Continue current management and monitoring of blood pressure.  VITAMIN D  DEFICIENCY: Vitamin D  levels slightly low. Reassessment is necessary. -Will recheck vitamin D  levels.  GASTROESOPHAGEAL REFLUX DISEASE WITH ESOPHAGITIS: Intermittent reflux symptoms, possibly exacerbated by dietary factors. Current management with Pepcid  Complete is partially effective. -Increase Pepcid  Complete to 20 mg if needed. -Take Pepcid  30 minutes before meals on an empty stomach. -Will consider  prescription-grade medication if symptoms persist.  TINNITUS AND TEMPOROMANDIBULAR JOINT DISORDER: Tinnitus more noticeable over the past 2-3 months. Possible TMJ involvement. No recent exposure to loud noises. -Referred to ENT for evaluation of tinnitus and TMJ. -Consider saline rinses and intranasal steroids for potential allergic rhinitis.  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 -Referred to ENT for evaluation of buccal mass.  ALLERGIC RHINITIS: Possible allergic rhinitis contributing to tinnitus. Nasal examination shows active turbinates. -Start saline nasal rinses. -Use intranasal steroid spray such as Flonase.  INSOMNIA: Improved with bedtime routine. Trazodone  used as needed but causes morning grogginess. -Try taking trazodone  earlier in the evening. -Consider alternative non-habit forming medications if needed.  MAJOR DEPRESSIVE DISORDER: Wellbutrin  300 mg daily is well-tolerated. Stress levels have improved. -Continue Wellbutrin  300 mg daily.  ERECTILE DYSFUNCTION: Difficulty maintaining erection. Discussed potential use of Cialis for daily management. -Prescribed Cialis for daily use. -Encouraged physical activity and stress management.  PARESTHESIA, LEFT HAND (IMPROVING): Left hand numbness has improved but not fully resolved. No recent use of brace. -Encouraged use of brace as needed. -Recommended flexibility exercises for elbow and wrist.  ABNORMAL KIDNEY FUNCTION (MILD, MONITORED): Mildly elevated creatinine, possibly due to inflammation or medication use. -Continue monitoring kidney function.

## 2024-03-19 ENCOUNTER — Ambulatory Visit: Payer: Self-pay | Admitting: Family Medicine

## 2024-03-19 DIAGNOSIS — E785 Hyperlipidemia, unspecified: Secondary | ICD-10-CM

## 2024-03-19 DIAGNOSIS — E559 Vitamin D deficiency, unspecified: Secondary | ICD-10-CM

## 2024-03-19 LAB — COMPREHENSIVE METABOLIC PANEL WITH GFR
ALT: 39 IU/L (ref 0–44)
AST: 27 IU/L (ref 0–40)
Albumin: 4.3 g/dL (ref 4.1–5.1)
Alkaline Phosphatase: 106 IU/L (ref 47–123)
BUN/Creatinine Ratio: 9 (ref 9–20)
BUN: 12 mg/dL (ref 6–24)
Bilirubin Total: 0.4 mg/dL (ref 0.0–1.2)
CO2: 23 mmol/L (ref 20–29)
Calcium: 9.2 mg/dL (ref 8.7–10.2)
Chloride: 104 mmol/L (ref 96–106)
Creatinine, Ser: 1.39 mg/dL — ABNORMAL HIGH (ref 0.76–1.27)
Globulin, Total: 2.7 g/dL (ref 1.5–4.5)
Glucose: 93 mg/dL (ref 70–99)
Potassium: 3.9 mmol/L (ref 3.5–5.2)
Sodium: 140 mmol/L (ref 134–144)
Total Protein: 7 g/dL (ref 6.0–8.5)
eGFR: 65 mL/min/1.73 (ref >59)

## 2024-03-19 LAB — LIPID PANEL
Chol/HDL Ratio: 5.7 ratio — ABNORMAL HIGH (ref 0.0–5.0)
Cholesterol, Total: 258 mg/dL — ABNORMAL HIGH (ref 100–199)
HDL: 45 mg/dL (ref >39)
LDL Chol Calc (NIH): 193 mg/dL — ABNORMAL HIGH (ref 0–99)
Triglycerides: 111 mg/dL (ref 0–149)
VLDL Cholesterol Cal: 20 mg/dL (ref 5–40)

## 2024-03-19 LAB — CBC
Hematocrit: 47.3 % (ref 37.5–51.0)
Hemoglobin: 15.6 g/dL (ref 13.0–17.7)
MCH: 28.8 pg (ref 26.6–33.0)
MCHC: 33 g/dL (ref 31.5–35.7)
MCV: 87 fL (ref 79–97)
Platelets: 281 x10E3/uL (ref 150–450)
RBC: 5.41 x10E6/uL (ref 4.14–5.80)
RDW: 13.6 % (ref 11.6–15.4)
WBC: 7.7 x10E3/uL (ref 3.4–10.8)

## 2024-03-19 LAB — HEMOGLOBIN A1C
Est. average glucose Bld gHb Est-mCnc: 126 mg/dL
Hgb A1c MFr Bld: 6 % — ABNORMAL HIGH (ref 4.8–5.6)

## 2024-03-19 LAB — VITAMIN D 25 HYDROXY (VIT D DEFICIENCY, FRACTURES): Vit D, 25-Hydroxy: 20.2 ng/mL — ABNORMAL LOW (ref 30.0–100.0)

## 2024-03-19 LAB — PSA TOTAL (REFLEX TO FREE): Prostate Specific Ag, Serum: 1 ng/mL (ref 0.0–4.0)

## 2024-03-19 MED ORDER — VITAMIN D (ERGOCALCIFEROL) 1.25 MG (50000 UNIT) PO CAPS: 50000.0000 [IU] | ORAL_CAPSULE | ORAL | 0 refills | Status: AC

## 2024-03-19 MED ORDER — ROSUVASTATIN CALCIUM 10 MG PO TABS: 10.0000 mg | ORAL_TABLET | Freq: Every day | ORAL | 3 refills | Status: AC

## 2024-03-19 NOTE — Progress Notes (Signed)
 The 10-year ASCVD risk score (Arnett DK, et al., 2019) is: 5.1%   Values used to calculate the score:     Age: 43 years     Clincally relevant sex: Male     Is Non-Hispanic African American: Yes     Diabetic: No     Tobacco smoker: No     Systolic Blood Pressure: 116 mmHg     Is BP treated: Yes     HDL Cholesterol: 45 mg/dL     Total Cholesterol: 258 mg/dL

## 2024-03-21 ENCOUNTER — Encounter: Payer: Self-pay | Admitting: Family Medicine

## 2024-03-21 NOTE — Telephone Encounter (Signed)
 Requested medication (s) are due for refill today: yes  Requested medication (s) are on the active medication list: yes  Last refill:  03/18/24  Future visit scheduled: no  Notes to clinic:  Pharmacy comment: Alternative Requested:NOT FORMULARY.      Requested Prescriptions  Pending Prescriptions Disp Refills   Liraglutide -Weight Management 18 MG/3ML SOPN [Pharmacy Med Name: LIRAGLUTIDE 5-PAK 18 MG/3 ML]  0     Endocrinology:  Diabetes - GLP-1 Receptor Agonists Passed - 03/21/2024  1:20 PM      Passed - HBA1C is between 0 and 7.9 and within 180 days    Hgb A1c MFr Bld  Date Value Ref Range Status  03/18/2024 6.0 (H) 4.8 - 5.6 % Final    Comment:             Prediabetes: 5.7 - 6.4          Diabetes: >6.4          Glycemic control for adults with diabetes: <7.0          Passed - Valid encounter within last 6 months    Recent Outpatient Visits           3 days ago Healthcare maintenance   [facility] at [facility], Selinda PARAS, MD   6 months ago Primary hypertension   [facility] Primary Care [facility] Medicine at [facility], Selinda PARAS, MD

## 2024-03-21 NOTE — Telephone Encounter (Signed)
 Please review. JM

## 2024-03-22 NOTE — Telephone Encounter (Signed)
 Good afternoon, we need his prescription card information in order to submit the PA.  The following information is needed from his card:  Rx bin# Rx PCN# Rx ID# Rx Group#

## 2024-03-22 NOTE — Telephone Encounter (Signed)
 Please complete PA.    KP

## 2024-04-02 ENCOUNTER — Other Ambulatory Visit: Payer: Self-pay | Admitting: Otolaryngology

## 2024-04-02 ENCOUNTER — Telehealth: Payer: Self-pay

## 2024-04-02 NOTE — Telephone Encounter (Signed)
 FYI  KP

## 2024-04-02 NOTE — Telephone Encounter (Signed)
 Tried to call patient no answer. VM left for patient to call back.  JM

## 2024-04-02 NOTE — Telephone Encounter (Signed)
 Copied from CRM #[id number]. Topic: Clinical - Medication Question >> Apr 02, 2024 12:09 PM Winona R wrote: Pt returned office call. Pt is ok with taking a daily injection, and has scheduled a in [facility] appointment for Tomorrow the 17th for 10:20am. Please give him a call if you need anything additional. Pt looks forward to learning more

## 2024-04-03 ENCOUNTER — Encounter: Payer: Self-pay | Admitting: Family Medicine

## 2024-04-03 ENCOUNTER — Ambulatory Visit: Admitting: Family Medicine

## 2024-04-03 VITALS — BP 112/88 | HR 88 | Ht 69.0 in | Wt 266.0 lb

## 2024-04-03 DIAGNOSIS — E66812 Obesity, class 2: Secondary | ICD-10-CM

## 2024-04-03 DIAGNOSIS — E785 Hyperlipidemia, unspecified: Secondary | ICD-10-CM

## 2024-04-03 DIAGNOSIS — E662 Morbid (severe) obesity with alveolar hypoventilation: Secondary | ICD-10-CM | POA: Diagnosis not present

## 2024-04-03 DIAGNOSIS — G4733 Obstructive sleep apnea (adult) (pediatric): Secondary | ICD-10-CM

## 2024-04-03 DIAGNOSIS — Z6839 Body mass index (BMI) 39.0-39.9, adult: Secondary | ICD-10-CM | POA: Diagnosis not present

## 2024-04-05 ENCOUNTER — Encounter: Payer: Self-pay | Admitting: Otolaryngology

## 2024-04-05 NOTE — Telephone Encounter (Signed)
 Please view. Pts insurance is updated.  KP

## 2024-04-05 NOTE — Anesthesia Preprocedure Evaluation (Addendum)
"                                    Anesthesia Evaluation  Patient identified by MRN, date of birth, ID band Patient awake    Reviewed: Allergy & Precautions, H&P , NPO status , Patient's Chart, lab work & pertinent test results  Airway Mallampati: II  TM Distance: >3 FB Neck ROM: Full    Dental no notable dental hx.    Pulmonary asthma , sleep apnea , former smoker   Pulmonary exam normal breath sounds clear to auscultation       Cardiovascular hypertension, Normal cardiovascular exam+ dysrhythmias  Rhythm:Regular Rate:Normal     Neuro/Psych  PSYCHIATRIC DISORDERS Anxiety Depression    negative neurological ROS  negative psych ROS   GI/Hepatic negative GI ROS, Neg liver ROS,GERD  ,,  Endo/Other  negative endocrine ROS    Renal/GU negative Renal ROS  negative genitourinary   Musculoskeletal negative musculoskeletal ROS (+)    Abdominal   Peds negative pediatric ROS (+)  Hematology negative hematology ROS (+)   Anesthesia Other Findings Anxiety  Depression Attention deficit hyperactivity disorder (ADHD) Complications affecting other specified body systems,  Arthralgia of right temporomandibular joint  Hypertension HLD (hyperlipidemia) Tremor of both hands GERD (gastroesophageal reflux disease)  Asthma OSA on CPAP  Anxiety and depression Psychophysiological insomnia  TMJ dysfunction    Reproductive/Obstetrics negative OB ROS                              Anesthesia Physical Anesthesia Plan  ASA: 2  Anesthesia Plan: General   Post-op Pain Management:    Induction: Intravenous  PONV Risk Score and Plan:   Airway Management Planned: Natural Airway, Nasal Cannula and LMA  Additional Equipment:   Intra-op Plan:   Post-operative Plan: Extubation in OR  Informed Consent: I have reviewed the patients History and Physical, chart, labs and discussed the procedure including the risks, benefits and alternatives  for the proposed anesthesia with the patient or authorized representative who has indicated his/her understanding and acceptance.     Dental Advisory Given  Plan Discussed with: Anesthesiologist, CRNA and Surgeon  Anesthesia Plan Comments: (Patient consented for risks of anesthesia including but not limited to:  - adverse reactions to medications - risk of airway placement if required - damage to eyes, teeth, lips or other oral mucosa - nerve damage due to positioning  - sore throat or hoarseness - Damage to heart, brain, nerves, lungs, other parts of body or loss of life  Patient voiced understanding and assent.)         Anesthesia Quick Evaluation  "

## 2024-04-08 ENCOUNTER — Encounter: Payer: Self-pay | Admitting: Otolaryngology

## 2024-04-08 ENCOUNTER — Telehealth: Payer: Self-pay | Admitting: Pharmacy Technician

## 2024-04-08 ENCOUNTER — Other Ambulatory Visit (HOSPITAL_COMMUNITY): Payer: Self-pay

## 2024-04-08 NOTE — Telephone Encounter (Signed)
 PA request has been Received. New Encounter has been or will be created for follow up. For additional info see Pharmacy Prior Auth telephone encounter from 04/08/24.

## 2024-04-08 NOTE — Telephone Encounter (Signed)
Denied by insurance.  KP 

## 2024-04-08 NOTE — Telephone Encounter (Signed)
 Pharmacy Patient Advocate Encounter   Received notification from Patient Advice Request messages that prior authorization for Zepbound  2.5 mg/0.24ml pen is required/requested.   Insurance verification completed.   The patient is insured through [employer].   Per test claim: Per test claim, medication is not covered due to plan/benefit exclusion, PA not submitted at this time

## 2024-04-10 NOTE — Patient Instructions (Signed)
 VISIT SUMMARY:  Today, we discussed your obstructive sleep apnea and obesity management, focusing on the coordination of GLP-1 agonist therapy. We also reviewed your hyperlipidemia treatment plan.  YOUR PLAN:  OBSTRUCTIVE SLEEP APNEA: You have moderate obstructive sleep apnea, and we are working on clinical biochemist for Zepbound  (tirzepatide ) to help manage it. -Provide your prescription insurance information for prior authorization of Zepbound . -Submit your prescription card information via MyChart or [employer]. -We will wait for the insurance response before considering alternative therapy. -Follow-up will be arranged after the insurance determination.  OBESITY: Your obesity is being managed as a comorbidity of sleep apnea, and we are considering GLP-1 agonist therapy for weight loss. -If Zepbound  is not approved, we will consider liraglutide (Victoza) as an alternative. -We discussed the dosing and administration of liraglutide. -Liraglutide can help with weight loss but may cause side effects like appetite suppression, delayed gastric emptying, and constipation. -Maintain a daily bowel regimen to prevent constipation. -Monitor for side effects and start liraglutide only if Zepbound  is not approved. -Follow-up every three months if GLP-1 therapy is initiated, with possible dose escalation.  HYPERLIPIDEMIA: Your hyperlipidemia is being managed with Crestor , and you have a family history of statin intolerance. -Monitor for muscle pain and report any persistent or severe symptoms. -Consider CoQ10 supplementation and a statin switch if intolerable side effects occur. -Continue your current Crestor  therapy as it is safe and effective.

## 2024-04-10 NOTE — Progress Notes (Signed)
 "    Primary Care / Sports Medicine Office Visit  Patient Information:  Patient ID: Jonathon Lozano, male DOB: August 02, 1980 Age: 43 y.o. MRN: [id number]   Jonathon Lozano is a pleasant 43 y.o. male presenting with the following:  Chief Complaint  Patient presents with   Follow-up    Patient presents today for clarity on the daily injection for OSA.    Vitals:   04/03/24 1030  BP: 112/88  Pulse: 88  SpO2: 96%   Vitals:   04/03/24 1030  Weight: 266 lb (120.7 kg)  Height: 5' 9 (1.753 m)   Body mass index is 39.28 kg/m.  No results found.   Discussed the use of AI scribe software for clinical note transcription with the patient, who gave verbal consent to proceed.   Independent interpretation of notes and tests performed by another provider:   None  Procedures performed:   None  Pertinent History, Exam, Impression, and Recommendations:   History of Present Illness Jonathon Lozano is a 43 year old male with obstructive sleep apnea and obesity who presents for medication coordination and insurance authorization for GLP-1 agonist therapy.  Glp-1 agonist therapy coordination - Seeking clarification regarding prior authorization process for Zepbound , prescribed for obstructive sleep apnea. - After prescription submission, received rapid response from [facility] indicating an alternative treatment was not authorized. - Contacted by prior authorization team for prescription card information, which has not yet been provided due to the holidays. - Received notification that insurance prefers coverage for liraglutide, a daily GLP-1 agonist, rather than Zepbound . - Uncertain if Zepbound  denial was due to incomplete information or misclassification of the indication. - Awaiting definitive response from insurance regarding Zepbound  coverage. - Considering liraglutide as an alternative if Zepbound  is not approved.  Gastrointestinal function - Regular bowel movements. - No  constipation or changes in bowel habits.  Glycemic symptoms - No symptoms of hypoglycemia or hyperglycemia.  Assessment and Plan Obstructive sleep apnea Moderate obstructive sleep apnea managed with GLP-1 agonist therapy. Awaiting insurance response for Zepbound  (tirzepatide ) coverage specifically for sleep apnea. - Instructed him to provide prescription insurance information for prior authorization of Zepbound . - Advised submission of prescription card information via MyChart or [employer]. - Awaiting insurance response before considering alternative therapy. - Arranged follow-up post insurance determination.  Obesity Obesity managed as a comorbidity of sleep apnea. GLP-1 agonist therapy considered for weight loss. Insurance covers liraglutide (Victoza) if Zepbound  is not approved. - Discussed liraglutide as an alternative if Zepbound  is not approved, including dosing and administration. - Reviewed liraglutide efficacy for weight loss. - Discussed side effects: appetite suppression, delayed gastric emptying, constipation, rare GI symptoms. - Provided guidance on bowel regimen to prevent constipation; instructed to maintain daily bowel movements. - Advised to monitor side effects and initiate liraglutide only if Zepbound  is not approved. - Planned follow-up every three months if GLP-1 therapy is initiated, with possible dose escalation.  Hyperlipidemia Hyperlipidemia managed with Crestor . Family history of statin intolerance. Low likelihood of adverse effects at current dosing. - Advised to monitor for myalgias and report persistent or severe symptoms. - Discussed CoQ10 supplementation and statin switch if intolerable side effects occur. - Reassured regarding safety and efficacy of Crestor ; advised to continue current therapy.  Problem List Items Addressed This Visit     Class 2 obesity with alveolar hypoventilation, serious comorbidity, and body mass index (BMI) of 39.0 to 39.9 in adult  (HCC)   Hyperlipidemia   OSA on CPAP - Primary  Orders & Medications Medications: No orders of the defined types were placed in this encounter.  No orders of the defined types were placed in this encounter.    No follow-ups on file.     Selinda JINNY Ku, MD, [facility]   Primary Care Sports Medicine Primary Care and Sports Medicine at [facility]   "

## 2024-04-15 ENCOUNTER — Ambulatory Visit
Admission: RE | Admit: 2024-04-15 | Discharge: 2024-04-15 | Disposition: A | Discharge status: Home / Self Care | Attending: Otolaryngology | Admitting: Otolaryngology

## 2024-04-15 ENCOUNTER — Other Ambulatory Visit: Payer: Self-pay

## 2024-04-15 ENCOUNTER — Ambulatory Visit: Payer: Self-pay | Admitting: Anesthesiology

## 2024-04-15 ENCOUNTER — Encounter: Payer: Self-pay | Admitting: Otolaryngology

## 2024-04-15 ENCOUNTER — Encounter
Admission: RE | Disposition: A | Discharge status: Home / Self Care | Payer: Self-pay | Source: Home / Self Care | Attending: Otolaryngology

## 2024-04-15 DIAGNOSIS — F419 Anxiety disorder, unspecified: Secondary | ICD-10-CM | POA: Diagnosis not present

## 2024-04-15 DIAGNOSIS — L720 Epidermal cyst: Secondary | ICD-10-CM | POA: Insufficient documentation

## 2024-04-15 DIAGNOSIS — G4730 Sleep apnea, unspecified: Secondary | ICD-10-CM | POA: Insufficient documentation

## 2024-04-15 DIAGNOSIS — D2330 Other benign neoplasm of skin of unspecified part of face: Secondary | ICD-10-CM | POA: Diagnosis present

## 2024-04-15 DIAGNOSIS — J45909 Unspecified asthma, uncomplicated: Secondary | ICD-10-CM | POA: Insufficient documentation

## 2024-04-15 DIAGNOSIS — K219 Gastro-esophageal reflux disease without esophagitis: Secondary | ICD-10-CM | POA: Insufficient documentation

## 2024-04-15 DIAGNOSIS — Z87891 Personal history of nicotine dependence: Secondary | ICD-10-CM | POA: Insufficient documentation

## 2024-04-15 DIAGNOSIS — I10 Essential (primary) hypertension: Secondary | ICD-10-CM | POA: Insufficient documentation

## 2024-04-15 DIAGNOSIS — F32A Depression, unspecified: Secondary | ICD-10-CM | POA: Insufficient documentation

## 2024-04-15 DIAGNOSIS — Z79899 Other long term (current) drug therapy: Secondary | ICD-10-CM | POA: Insufficient documentation

## 2024-04-15 HISTORY — PX: EXCISION MASS HEAD: SHX6702

## 2024-04-15 HISTORY — DX: Cardiac arrhythmia, unspecified: I49.9

## 2024-04-15 HISTORY — DX: Prediabetes: R73.03

## 2024-04-15 SURGERY — EXCISION, MASS, HEAD
Anesthesia: General | Site: Face | Laterality: Left

## 2024-04-15 MED ORDER — LIDOCAINE 1% INJECTION FOR CIRCUMCISION
INJECTION | INTRAVENOUS | Status: DC | PRN
  Administered 2024-04-15: 4 mL via SUBCUTANEOUS

## 2024-04-15 MED ORDER — LIDOCAINE HCL (CARDIAC) PF 100 MG/5ML IV SOSY
PREFILLED_SYRINGE | INTRAVENOUS | Status: DC | PRN
  Administered 2024-04-15: 100 mg via INTRATRACHEAL

## 2024-04-15 MED ORDER — PROPOFOL 10 MG/ML IV BOLUS
INTRAVENOUS | Status: DC | PRN
  Administered 2024-04-15: 200 mg via INTRAVENOUS

## 2024-04-15 MED ORDER — MIDAZOLAM HCL 5 MG/5ML IJ SOLN
INTRAMUSCULAR | Status: DC | PRN
  Administered 2024-04-15 (×2): 2 mg via INTRAVENOUS

## 2024-04-15 MED ORDER — DEXAMETHASONE SODIUM PHOSPHATE 4 MG/ML IJ SOLN
INTRAMUSCULAR | Status: DC | PRN
  Administered 2024-04-15: 4 mg via INTRAVENOUS

## 2024-04-15 MED ORDER — MIDAZOLAM HCL 2 MG/2ML IJ SOLN
INTRAMUSCULAR | Status: AC
  Filled 2024-04-15: qty 2

## 2024-04-15 MED ORDER — FENTANYL CITRATE (PF) 100 MCG/2ML IJ SOLN
INTRAMUSCULAR | Status: DC | PRN
  Administered 2024-04-15: 50 ug via INTRAVENOUS

## 2024-04-15 MED ORDER — LIDOCAINE HCL (PF) 2 % IJ SOLN
INTRAMUSCULAR | Status: AC
  Filled 2024-04-15: qty 5

## 2024-04-15 MED ORDER — HYDROCODONE-ACETAMINOPHEN 5-325 MG PO TABS: 1.0000 | ORAL_TABLET | Freq: Four times a day (QID) | ORAL | 0 refills | Status: AC | PRN

## 2024-04-15 MED ORDER — ONDANSETRON HCL 4 MG/2ML IJ SOLN
INTRAMUSCULAR | Status: DC | PRN
  Administered 2024-04-15: 4 mg via INTRAVENOUS

## 2024-04-15 MED ORDER — ONDANSETRON HCL 4 MG/2ML IJ SOLN
INTRAMUSCULAR | Status: AC
  Filled 2024-04-15: qty 2

## 2024-04-15 MED ORDER — DEXAMETHASONE SODIUM PHOSPHATE 4 MG/ML IJ SOLN
INTRAMUSCULAR | Status: AC
  Filled 2024-04-15: qty 1

## 2024-04-15 MED ORDER — PROPOFOL 10 MG/ML IV BOLUS
INTRAVENOUS | Status: AC
  Filled 2024-04-15: qty 20

## 2024-04-15 MED ORDER — LACTATED RINGERS IV SOLN: INTRAVENOUS | Status: DC

## 2024-04-15 MED ORDER — FENTANYL CITRATE (PF) 100 MCG/2ML IJ SOLN
INTRAMUSCULAR | Status: AC
  Filled 2024-04-15: qty 2

## 2024-04-15 SURGICAL SUPPLY — 14 items
CORD BIP STRL DISP 12FT (MISCELLANEOUS) IMPLANT
GAUZE SPONGE 4X4 12PLY STRL (GAUZE/BANDAGES/DRESSINGS) IMPLANT
GLOVE SURG GAMMEX PI TX LF 7.5 (GLOVE) ×2 IMPLANT
GOWN STRL REUS W/ TWL LRG LVL3 (GOWN DISPOSABLE) IMPLANT
KIT TURNOVER KIT A (KITS) ×1 IMPLANT
NEEDLE 27GX1/2 REG BEVEL ECLIP (NEEDLE) ×1 IMPLANT
NS IRRIG 500ML POUR BTL (IV SOLUTION) ×1 IMPLANT
PACK ENT CUSTOM (PACKS) ×1 IMPLANT
SOLUTION PREP PVP 2OZ (MISCELLANEOUS) ×1 IMPLANT
STRAP BODY AND KNEE 60X3 (MISCELLANEOUS) ×1 IMPLANT
SUCTION TUBE FRAZIER 10FR DISP (SUCTIONS) IMPLANT
SUT CHROMIC 5 0 RB 1 27 (SUTURE) IMPLANT
SUT PROLENE 6 0 P 1 18 (SUTURE) IMPLANT
SYR 10ML LL (SYRINGE) ×1 IMPLANT

## 2024-04-15 NOTE — Anesthesia Procedure Notes (Signed)
 Procedure Name: LMA Insertion Date/Time: 04/15/2024 12:05 PM  Performed by: Shenise Wolgamott, CRNAPre-anesthesia Checklist: Patient identified, Emergency Drugs available, Suction available, Patient being monitored and Timeout performed Patient Re-evaluated:Patient Re-evaluated prior to induction Oxygen Delivery Method: Circle system utilized Preoxygenation: Pre-oxygenation with 100% oxygen Induction Type: IV induction LMA: LMA inserted LMA Size: 5.0 Tube type: Oral Number of attempts: 1 Placement Confirmation: breath sounds checked- equal and bilateral, CO2 detector and positive ETCO2 Tube secured with: Tape Dental Injury: Teeth and Oropharynx as per pre-operative assessment

## 2024-04-15 NOTE — H&P (Signed)
H&P has been reviewed and patient reevaluated, no changes necessary. To be downloaded later.  

## 2024-04-15 NOTE — Anesthesia Postprocedure Evaluation (Signed)
"   Anesthesia Post Note  Patient: Jonathon Lozano  Procedure(s) Performed: EXCISION, MASS, HEAD (Left: Face)  Patient location during evaluation: PACU Anesthesia Type: General Level of consciousness: awake and alert Pain management: pain level controlled Vital Signs Assessment: post-procedure vital signs reviewed and stable Respiratory status: spontaneous breathing, nonlabored ventilation, respiratory function stable and patient connected to nasal cannula oxygen Cardiovascular status: blood pressure returned to baseline and stable Postop Assessment: no apparent nausea or vomiting Anesthetic complications: no   No notable events documented.   Last Vitals:  Vitals:   04/15/24 1255 04/15/24 1300  BP: 118/83 108/72  Pulse: 80 80  Resp: 13 17  Temp: 36.5 C   SpO2: 97% 97%    Last Pain:  Vitals:   04/15/24 1255  TempSrc:   PainSc: 0-No pain                 Zareya Tuckett C Shep Porter      "

## 2024-04-15 NOTE — Op Note (Signed)
 04/15/2024  12:36 PM    Jonathon Lozano  [id number]   Pre-Op Dx: Subcutaneous lesion in the left cheek  Post-op Dx: Sebaceous cyst in the left cheek.  Proc: Excision of subcu cyst in the left cheek  Surg:  Jonathon Lozano  Anes:  Gen By LMA  EBL: 10 mL  Comp: None  Findings: Yellowish cystic structure attached at the inferior skin margin just inferior to the suture line.  Procedure: Patient was given general anesthesia by oral endotracheal intubation. Spine position.  His head was turned slightly to the right side so I could see the left cheek.  I could palpate the mass in his left mid cheek area.  I drew a circle on the skin around it and a mostly horizontal incision that angled slightly to follow his skin crease lines.  The incision line was drawn 1.5 cm in size.  I then infiltrated 4 mL of 1% Xylocaine  with epi 1: 100,000 into the soft tissues around the lesion.  He was then prepped and draped in a sterile fashion.  The skin incision was carried through the full-thickness skin down to the subcu layer.  Subcu was then elevated inferiorly and superiorly over the mass.  I could see the mass coming to view and it was a yellowish roundish soft tissue mass.  This appeared to be cystic with a thick fluid in it.  I used blunt dissection to dissect all the way around it and remove some of the muscle fibers overlying so that.  The mass itself was a little over a centimeter in diameter.  The mass was completely freed up all around its deep surface until I could then deliver it from the wound.  It was still attached slightly to the inferior portion of the skin edge.  I treated up and the attachment at the dermis was then cut off to remove the entire cyst and glandular portion from the dermis/epidermis.  The cyst was still intact.  This was sent for permanent section.  The wound was irrigated copiously with saline.  There were a couple small little bleeding areas that were cauterized with bipolar  cautery.  These were right underneath the skin edge.  There is no deep bleeding.  The wound was then closed using a 5-0 chromic to help close the full dermis and elevate the skin edges.  The skin was then closed with a running locking 6-0 Prolene suture.  Once the wound was closed it was covered with small amount of ointment and then a Band-Aid.  The patient tolerated the procedure well.  He was awakened and taken to the recovery room in satisfactory condition.  There were no operative complications.  Dispo:   To PACU to be discharged home  Plan: To follow-up in the office in 1 week for suture removal and also to go over the path report make sure that the pathology is benign.  I have given him some Tylenol  with hydrocodone  pills that he can use for severe pain but otherwise would just use Tylenol  for pain.  If you have a Band-Aid over the wound 24 hours a day until I see him back.  Jonathon Lozano  04/15/2024 12:36 PM

## 2024-04-15 NOTE — Transfer of Care (Signed)
 Immediate Anesthesia Transfer of Care Note  Patient: Jonathon Lozano  Procedure(s) Performed: EXCISION, MASS, HEAD (Left: Face)  Patient Location: PACU  Anesthesia Type: General  Level of Consciousness: awake, alert  and patient cooperative  Airway and Oxygen Therapy: Patient Spontanous Breathing and Patient connected to supplemental oxygen  Post-op Assessment: Post-op Vital signs reviewed, Patient's Cardiovascular Status Stable, Respiratory Function Stable, Patent Airway and No signs of Nausea or vomiting  Post-op Vital Signs: Reviewed and stable  Complications: No notable events documented.

## 2024-04-17 LAB — SURGICAL PATHOLOGY

## 2024-04-23 ENCOUNTER — Encounter: Payer: Self-pay | Admitting: Family Medicine
